# Patient Record
Sex: Male | Born: 1990 | Race: Black or African American | Hispanic: No | Marital: Married | Smoking: Current every day smoker
Health system: Southern US, Community
[De-identification: ages and names within clinical notes are randomized; demographics above are authoritative.]

## PROBLEM LIST (undated history)

## (undated) DIAGNOSIS — I509 Heart failure, unspecified: Secondary | ICD-10-CM

## (undated) DIAGNOSIS — E119 Type 2 diabetes mellitus without complications: Secondary | ICD-10-CM

## (undated) DIAGNOSIS — M419 Scoliosis, unspecified: Secondary | ICD-10-CM

## (undated) HISTORY — PX: WISDOM TOOTH EXTRACTION: SHX21

## (undated) HISTORY — PX: TONSILLECTOMY: SUR1361

---

## 2020-12-28 ENCOUNTER — Encounter (HOSPITAL_BASED_OUTPATIENT_CLINIC_OR_DEPARTMENT_OTHER): Payer: Self-pay | Admitting: Emergency Medicine

## 2020-12-28 ENCOUNTER — Emergency Department (HOSPITAL_BASED_OUTPATIENT_CLINIC_OR_DEPARTMENT_OTHER)
Admission: EM | Admit: 2020-12-28 | Discharge: 2020-12-28 | Disposition: A | Discharge status: Home / Self Care | Payer: 59 | Attending: Emergency Medicine | Admitting: Emergency Medicine

## 2020-12-28 ENCOUNTER — Other Ambulatory Visit: Payer: Self-pay

## 2020-12-28 DIAGNOSIS — M546 Pain in thoracic spine: Secondary | ICD-10-CM | POA: Insufficient documentation

## 2020-12-28 DIAGNOSIS — F17290 Nicotine dependence, other tobacco product, uncomplicated: Secondary | ICD-10-CM | POA: Insufficient documentation

## 2020-12-28 DIAGNOSIS — G8929 Other chronic pain: Secondary | ICD-10-CM | POA: Diagnosis not present

## 2020-12-28 DIAGNOSIS — I10 Essential (primary) hypertension: Secondary | ICD-10-CM | POA: Diagnosis not present

## 2020-12-28 DIAGNOSIS — E119 Type 2 diabetes mellitus without complications: Secondary | ICD-10-CM | POA: Diagnosis not present

## 2020-12-28 DIAGNOSIS — M5441 Lumbago with sciatica, right side: Secondary | ICD-10-CM | POA: Insufficient documentation

## 2020-12-28 HISTORY — DX: Scoliosis, unspecified: M41.9

## 2020-12-28 HISTORY — DX: Type 2 diabetes mellitus without complications: E11.9

## 2020-12-28 HISTORY — DX: Heart failure, unspecified: I50.9

## 2020-12-28 MED ORDER — TIZANIDINE HCL 4 MG PO CAPS: 4.0000 mg | ORAL_CAPSULE | Freq: Two times a day (BID) | ORAL | 0 refills | Status: AC | PRN

## 2020-12-28 MED ORDER — KETOROLAC TROMETHAMINE 60 MG/2ML IM SOLN
30.0000 mg | Freq: Once | INTRAMUSCULAR | Status: AC
  Administered 2020-12-28: 30 mg via INTRAMUSCULAR
  Filled 2020-12-28: qty 2

## 2020-12-28 MED ORDER — MORPHINE SULFATE (PF) 4 MG/ML IV SOLN
4.0000 mg | Freq: Once | INTRAVENOUS | Status: AC
Start: 2020-12-28 — End: 2020-12-28
  Administered 2020-12-28: 4 mg via INTRAMUSCULAR
  Filled 2020-12-28: qty 1

## 2020-12-28 NOTE — ED Provider Notes (Signed)
[facility] EMERGENCY DEPARTMENT Provider Note  CSN: [id number] Arrival date & time: 12/28/20 0008  Chief Complaint(s) Back Pain  HPI Gilbert Graham is a 30 y.o. male with a past medical history listed below including scoliosis with chronic back pain here for exacerbation of his back pain with associated radiculopathy down the right leg.  Patient denies any trauma.  No fevers.  No bladder/bowel incontinence.  Pain worse with movement and palpation.  Alleviated mildly with certain positions.   Back Pain  Past Medical History Past Medical History:  Diagnosis Date   CHF (congestive heart failure) (HCC)    Diabetes mellitus without complication (HCC)    Scoliosis    There are no problems to display for this patient.  Home Medication(s) Prior to Admission medications   Medication Sig Start Date End Date Taking? Authorizing Provider  tiZANidine (ZANAFLEX) 4 MG capsule Take 1 capsule (4 mg total) by mouth 2 (two) times daily as needed for muscle spasms. 12/28/20 01/27/21 Yes Bow Buntyn, Amadeo Garnet, MD                                                                                                                                    Past Surgical History Past Surgical History:  Procedure Laterality Date   TONSILLECTOMY     WISDOM TOOTH EXTRACTION     Family History Family History  Problem Relation Age of Onset   Asthma Mother    Heart failure Mother    Fibromyalgia Mother    Diabetes Father    Coronary artery disease Father     Social History Social History   Tobacco Use   Smoking status: Every Day    Types: Cigars   Smokeless tobacco: Never  Vaping Use   Vaping Use: Never used  Substance Use Topics   Alcohol use: Never   Drug use: Never   Allergies Amoxicillin and Imitrex [sumatriptan]  Review of Systems Review of Systems  Musculoskeletal:  Positive for back pain.  All other systems are reviewed and are negative for acute change except as noted in the  HPI  Physical Exam Vital Signs  I have reviewed the triage vital signs BP (!) 144/102 (BP Location: Right Arm)   Pulse (!) 103   Temp 98.9 F (37.2 C) (Oral)   Resp 20   Ht 5\' 6"  (1.676 m)   Wt 126.1 kg   SpO2 100%   BMI 44.87 kg/m   Physical Exam Vitals reviewed.  Constitutional:      General: He is not in acute distress.    Appearance: He is well-developed. He is obese. He is not diaphoretic.  HENT:     Head: Normocephalic and atraumatic.     Right Ear: External ear normal.     Left Ear: External ear normal.     Nose: Nose normal.     Mouth/Throat:     Mouth: Mucous membranes are moist.  Eyes:     General: No scleral icterus.    Conjunctiva/sclera: Conjunctivae normal.  Neck:     Trachea: Phonation normal.  Cardiovascular:     Rate and Rhythm: Normal rate and regular rhythm.  Pulmonary:     Effort: Pulmonary effort is normal. No respiratory distress.     Breath sounds: No stridor.  Abdominal:     General: There is no distension.  Musculoskeletal:        General: Normal range of motion.     Cervical back: Normal range of motion.     Thoracic back: Spasms and tenderness present.     Lumbar back: Spasms present.       Back:  Neurological:     Mental Status: He is alert and oriented to person, place, and time.     Comments: Spine Exam: Strength: 5/5 throughout LE bilaterally  Sensation: Intact to light touch in proximal and distal LE bilaterally    Psychiatric:        Behavior: Behavior normal.    ED Results and Treatments Labs (all labs ordered are listed, but only abnormal results are displayed) Labs Reviewed - No data to display                                                                                                                       EKG  EKG Interpretation  Date/Time:    Ventricular Rate:    PR Interval:    QRS Duration:   QT Interval:    QTC Calculation:   R Axis:     Text Interpretation:         Radiology No results  found.  Pertinent labs & imaging results that were available during my care of the patient were reviewed by me and considered in my medical decision making (see chart for details).  Medications Ordered in ED Medications  ketorolac (TORADOL) injection 30 mg (30 mg Intramuscular Given 12/28/20 0211)  morphine 4 MG/ML injection 4 mg (4 mg Intramuscular Given 12/28/20 0212)                                                                                                                                    Procedures Procedures  (including critical care time)  Medical Decision Making / ED Course I have reviewed the nursing notes for this encounter and the patient's prior records (if available in EHR or on provided  paperwork).   Radin Raptis was evaluated in Emergency Department on 12/28/2020 for the symptoms described in the history of present illness. He was evaluated in the context of the global COVID-19 pandemic, which necessitated consideration that the patient might be at risk for infection with the SARS-CoV-2 virus that causes COVID-19. Institutional protocols and algorithms that pertain to the evaluation of patients at risk for COVID-19 are in a state of rapid change based on information released by regulatory bodies including the CDC and federal and state organizations. These policies and algorithms were followed during the patient's care in the ED.  30 y.o. male presents with back pain in lumbar area for several weeks with signs of  right radicular pain. No acute traumatic onset. No red flag symptoms of fever, weight loss, saddle anesthesia, weakness, fecal/urinary incontinence or urinary retention.   Suspect MSK vs herniated disc. No indication for imaging emergently. Patient was recommended to take short course of scheduled NSAIDs and engage in early mobility as definitive treatment. Return precautions discussed for worsening or new concerning symptoms.        Final Clinical  Impression(s) / ED Diagnoses Final diagnoses:  Chronic bilateral low back pain with right-sided sciatica    The patient appears reasonably screened and/or stabilized for discharge and I doubt any other medical condition or other [facility] requiring further screening, evaluation, or treatment in the ED at this time prior to discharge. Safe for discharge with strict return precautions.  Disposition: Discharge  Condition: Good  I have discussed the results, Dx and Tx plan with the patient/family who expressed understanding and agree(s) with the plan. Discharge instructions discussed at length. The patient/family was given strict return precautions who verbalized understanding of the instructions. No further questions at time of discharge.    ED Discharge Orders          Ordered    tiZANidine (ZANAFLEX) 4 MG capsule  2 times daily PRN        12/28/20 0305             Follow Up: Primary care provider  Schedule an appointment as soon as possible for a visit  if you do not have a primary care physician, contact [facility] at [phone] for referral  Bedelia Person, MD [address] [place] [address] [phone]  Call  as needed     This chart was dictated using voice recognition software.  Despite best efforts to proofread,  errors can occur which can change the documentation meaning.    Nira Conn, MD 12/28/20 [phone]

## 2020-12-28 NOTE — ED Notes (Signed)
ED Provider at bedside. 

## 2020-12-28 NOTE — ED Triage Notes (Signed)
Pt is c/o lower back pain that started 2 weeks ago and has progressively gotten worse  Pt states the pain goes all the way across his lower back and pain is now radiating down his right leg  Pt states every now and then it pops and then burns  Pt has been using heat, ice, ibuprofen, and tylenol at home without relief

## 2021-02-06 DIAGNOSIS — M5416 Radiculopathy, lumbar region: Secondary | ICD-10-CM | POA: Diagnosis not present

## 2021-02-06 DIAGNOSIS — M545 Low back pain, unspecified: Secondary | ICD-10-CM | POA: Diagnosis not present

## 2021-02-11 DIAGNOSIS — I517 Cardiomegaly: Secondary | ICD-10-CM | POA: Diagnosis not present

## 2021-02-14 DIAGNOSIS — R69 Illness, unspecified: Secondary | ICD-10-CM | POA: Diagnosis not present

## 2021-02-14 DIAGNOSIS — M47816 Spondylosis without myelopathy or radiculopathy, lumbar region: Secondary | ICD-10-CM | POA: Diagnosis not present

## 2021-02-14 DIAGNOSIS — M5136 Other intervertebral disc degeneration, lumbar region: Secondary | ICD-10-CM | POA: Diagnosis not present

## 2021-02-14 DIAGNOSIS — M4306 Spondylolysis, lumbar region: Secondary | ICD-10-CM | POA: Diagnosis not present

## 2021-02-19 DIAGNOSIS — M4306 Spondylolysis, lumbar region: Secondary | ICD-10-CM | POA: Diagnosis not present

## 2021-02-19 DIAGNOSIS — M47816 Spondylosis without myelopathy or radiculopathy, lumbar region: Secondary | ICD-10-CM | POA: Diagnosis not present

## 2021-03-03 DIAGNOSIS — M4306 Spondylolysis, lumbar region: Secondary | ICD-10-CM | POA: Diagnosis not present

## 2021-03-03 DIAGNOSIS — M47816 Spondylosis without myelopathy or radiculopathy, lumbar region: Secondary | ICD-10-CM | POA: Diagnosis not present

## 2021-03-11 DIAGNOSIS — M792 Neuralgia and neuritis, unspecified: Secondary | ICD-10-CM | POA: Diagnosis not present

## 2021-03-11 DIAGNOSIS — M47816 Spondylosis without myelopathy or radiculopathy, lumbar region: Secondary | ICD-10-CM | POA: Diagnosis not present

## 2021-03-11 DIAGNOSIS — Z79891 Long term (current) use of opiate analgesic: Secondary | ICD-10-CM | POA: Diagnosis not present

## 2021-03-11 DIAGNOSIS — Z79899 Other long term (current) drug therapy: Secondary | ICD-10-CM | POA: Diagnosis not present

## 2021-03-11 DIAGNOSIS — G8929 Other chronic pain: Secondary | ICD-10-CM | POA: Diagnosis not present

## 2021-03-17 DIAGNOSIS — M47816 Spondylosis without myelopathy or radiculopathy, lumbar region: Secondary | ICD-10-CM | POA: Diagnosis not present

## 2021-03-31 DIAGNOSIS — M47816 Spondylosis without myelopathy or radiculopathy, lumbar region: Secondary | ICD-10-CM | POA: Diagnosis not present

## 2021-04-01 DIAGNOSIS — M47816 Spondylosis without myelopathy or radiculopathy, lumbar region: Secondary | ICD-10-CM | POA: Diagnosis not present

## 2021-04-03 DIAGNOSIS — E1143 Type 2 diabetes mellitus with diabetic autonomic (poly)neuropathy: Secondary | ICD-10-CM | POA: Diagnosis not present

## 2021-04-03 DIAGNOSIS — Z794 Long term (current) use of insulin: Secondary | ICD-10-CM | POA: Diagnosis not present

## 2021-04-03 DIAGNOSIS — I10 Essential (primary) hypertension: Secondary | ICD-10-CM | POA: Diagnosis not present

## 2021-04-03 DIAGNOSIS — M5441 Lumbago with sciatica, right side: Secondary | ICD-10-CM | POA: Diagnosis not present

## 2021-04-03 DIAGNOSIS — Z23 Encounter for immunization: Secondary | ICD-10-CM | POA: Diagnosis not present

## 2021-04-03 DIAGNOSIS — R1012 Left upper quadrant pain: Secondary | ICD-10-CM | POA: Diagnosis not present

## 2021-04-03 DIAGNOSIS — G8929 Other chronic pain: Secondary | ICD-10-CM | POA: Diagnosis not present

## 2021-04-03 DIAGNOSIS — E782 Mixed hyperlipidemia: Secondary | ICD-10-CM | POA: Diagnosis not present

## 2021-04-03 DIAGNOSIS — I509 Heart failure, unspecified: Secondary | ICD-10-CM | POA: Diagnosis not present

## 2021-04-30 DIAGNOSIS — M546 Pain in thoracic spine: Secondary | ICD-10-CM | POA: Diagnosis not present

## 2021-04-30 DIAGNOSIS — M47816 Spondylosis without myelopathy or radiculopathy, lumbar region: Secondary | ICD-10-CM | POA: Diagnosis not present

## 2021-04-30 DIAGNOSIS — G8929 Other chronic pain: Secondary | ICD-10-CM | POA: Diagnosis not present

## 2021-04-30 DIAGNOSIS — M792 Neuralgia and neuritis, unspecified: Secondary | ICD-10-CM | POA: Diagnosis not present

## 2021-06-19 DIAGNOSIS — I517 Cardiomegaly: Secondary | ICD-10-CM | POA: Diagnosis not present

## 2021-06-19 DIAGNOSIS — M546 Pain in thoracic spine: Secondary | ICD-10-CM | POA: Diagnosis not present

## 2021-06-23 DIAGNOSIS — M546 Pain in thoracic spine: Secondary | ICD-10-CM | POA: Diagnosis not present

## 2021-06-23 DIAGNOSIS — G8929 Other chronic pain: Secondary | ICD-10-CM | POA: Diagnosis not present

## 2021-06-23 DIAGNOSIS — M47816 Spondylosis without myelopathy or radiculopathy, lumbar region: Secondary | ICD-10-CM | POA: Diagnosis not present

## 2021-06-23 DIAGNOSIS — M792 Neuralgia and neuritis, unspecified: Secondary | ICD-10-CM | POA: Diagnosis not present

## 2021-10-22 DIAGNOSIS — G894 Chronic pain syndrome: Secondary | ICD-10-CM | POA: Diagnosis not present

## 2021-10-22 DIAGNOSIS — M791 Myalgia, unspecified site: Secondary | ICD-10-CM | POA: Diagnosis not present

## 2021-10-22 DIAGNOSIS — M5416 Radiculopathy, lumbar region: Secondary | ICD-10-CM | POA: Diagnosis not present

## 2021-10-29 ENCOUNTER — Emergency Department (HOSPITAL_BASED_OUTPATIENT_CLINIC_OR_DEPARTMENT_OTHER): Payer: 59

## 2021-10-29 ENCOUNTER — Encounter (HOSPITAL_BASED_OUTPATIENT_CLINIC_OR_DEPARTMENT_OTHER): Payer: Self-pay

## 2021-10-29 ENCOUNTER — Inpatient Hospital Stay (HOSPITAL_BASED_OUTPATIENT_CLINIC_OR_DEPARTMENT_OTHER)
Admission: EM | Admit: 2021-10-29 | Discharge: 2021-11-01 | DRG: 948 | Disposition: A | Discharge status: Home / Self Care | Payer: 59 | Attending: Internal Medicine | Admitting: Internal Medicine

## 2021-10-29 DIAGNOSIS — A419 Sepsis, unspecified organism: Secondary | ICD-10-CM

## 2021-10-29 DIAGNOSIS — R9431 Abnormal electrocardiogram [ECG] [EKG]: Secondary | ICD-10-CM | POA: Diagnosis present

## 2021-10-29 DIAGNOSIS — Z881 Allergy status to other antibiotic agents status: Secondary | ICD-10-CM

## 2021-10-29 DIAGNOSIS — R000 Tachycardia, unspecified: Secondary | ICD-10-CM | POA: Diagnosis not present

## 2021-10-29 DIAGNOSIS — R109 Unspecified abdominal pain: Secondary | ICD-10-CM | POA: Diagnosis not present

## 2021-10-29 DIAGNOSIS — Z20822 Contact with and (suspected) exposure to covid-19: Secondary | ICD-10-CM | POA: Diagnosis present

## 2021-10-29 DIAGNOSIS — R7401 Elevation of levels of liver transaminase levels: Principal | ICD-10-CM | POA: Diagnosis present

## 2021-10-29 DIAGNOSIS — R079 Chest pain, unspecified: Secondary | ICD-10-CM | POA: Diagnosis not present

## 2021-10-29 DIAGNOSIS — F4310 Post-traumatic stress disorder, unspecified: Secondary | ICD-10-CM | POA: Diagnosis present

## 2021-10-29 DIAGNOSIS — E876 Hypokalemia: Secondary | ICD-10-CM | POA: Diagnosis present

## 2021-10-29 DIAGNOSIS — R112 Nausea with vomiting, unspecified: Secondary | ICD-10-CM | POA: Diagnosis not present

## 2021-10-29 DIAGNOSIS — I5022 Chronic systolic (congestive) heart failure: Secondary | ICD-10-CM | POA: Diagnosis present

## 2021-10-29 DIAGNOSIS — I272 Pulmonary hypertension, unspecified: Secondary | ICD-10-CM | POA: Diagnosis present

## 2021-10-29 DIAGNOSIS — R7989 Other specified abnormal findings of blood chemistry: Secondary | ICD-10-CM | POA: Diagnosis present

## 2021-10-29 DIAGNOSIS — Z886 Allergy status to analgesic agent status: Secondary | ICD-10-CM

## 2021-10-29 DIAGNOSIS — F3181 Bipolar II disorder: Secondary | ICD-10-CM | POA: Diagnosis present

## 2021-10-29 DIAGNOSIS — Z8249 Family history of ischemic heart disease and other diseases of the circulatory system: Secondary | ICD-10-CM

## 2021-10-29 DIAGNOSIS — R748 Abnormal levels of other serum enzymes: Secondary | ICD-10-CM | POA: Diagnosis not present

## 2021-10-29 DIAGNOSIS — R1011 Right upper quadrant pain: Secondary | ICD-10-CM

## 2021-10-29 DIAGNOSIS — Z833 Family history of diabetes mellitus: Secondary | ICD-10-CM

## 2021-10-29 DIAGNOSIS — E119 Type 2 diabetes mellitus without complications: Secondary | ICD-10-CM | POA: Diagnosis present

## 2021-10-29 DIAGNOSIS — Z91148 Patient's other noncompliance with medication regimen for other reason: Secondary | ICD-10-CM

## 2021-10-29 DIAGNOSIS — F17290 Nicotine dependence, other tobacco product, uncomplicated: Secondary | ICD-10-CM | POA: Diagnosis present

## 2021-10-29 DIAGNOSIS — I509 Heart failure, unspecified: Secondary | ICD-10-CM

## 2021-10-29 DIAGNOSIS — Z6841 Body Mass Index (BMI) 40.0 and over, adult: Secondary | ICD-10-CM

## 2021-10-29 DIAGNOSIS — R1031 Right lower quadrant pain: Secondary | ICD-10-CM | POA: Diagnosis not present

## 2021-10-29 DIAGNOSIS — K5900 Constipation, unspecified: Secondary | ICD-10-CM | POA: Diagnosis present

## 2021-10-29 DIAGNOSIS — I429 Cardiomyopathy, unspecified: Secondary | ICD-10-CM | POA: Diagnosis present

## 2021-10-29 DIAGNOSIS — Z716 Tobacco abuse counseling: Secondary | ICD-10-CM

## 2021-10-29 DIAGNOSIS — F603 Borderline personality disorder: Secondary | ICD-10-CM | POA: Diagnosis present

## 2021-10-29 LAB — CBC
HCT: 44.4 % (ref 39.0–52.0)
Hemoglobin: 13.7 g/dL (ref 13.0–17.0)
MCH: 21.5 pg — ABNORMAL LOW (ref 26.0–34.0)
MCHC: 30.9 g/dL (ref 30.0–36.0)
MCV: 69.6 fL — ABNORMAL LOW (ref 80.0–100.0)
Platelets: 301 10*3/uL (ref 150–400)
RBC: 6.38 MIL/uL — ABNORMAL HIGH (ref 4.22–5.81)
RDW: 18.4 % — ABNORMAL HIGH (ref 11.5–15.5)
WBC: 13.2 10*3/uL — ABNORMAL HIGH (ref 4.0–10.5)
nRBC: 1.1 % — ABNORMAL HIGH (ref 0.0–0.2)

## 2021-10-29 LAB — COMPREHENSIVE METABOLIC PANEL
ALT: 1682 U/L — ABNORMAL HIGH (ref 0–44)
AST: 1744 U/L — ABNORMAL HIGH (ref 15–41)
Albumin: 3.5 g/dL (ref 3.5–5.0)
Alkaline Phosphatase: 133 U/L — ABNORMAL HIGH (ref 38–126)
Anion gap: 9 (ref 5–15)
BUN: 18 mg/dL (ref 6–20)
CO2: 22 mmol/L (ref 22–32)
Calcium: 8.2 mg/dL — ABNORMAL LOW (ref 8.9–10.3)
Chloride: 104 mmol/L (ref 98–111)
Creatinine, Ser: 1.14 mg/dL (ref 0.61–1.24)
GFR, Estimated: >60 mL/min (ref >60)
Glucose, Bld: 214 mg/dL — ABNORMAL HIGH (ref 70–99)
Potassium: 3.4 mmol/L — ABNORMAL LOW (ref 3.5–5.1)
Sodium: 135 mmol/L (ref 135–145)
Total Bilirubin: 1.5 mg/dL — ABNORMAL HIGH (ref 0.3–1.2)
Total Protein: 7 g/dL (ref 6.5–8.1)

## 2021-10-29 LAB — URINALYSIS, ROUTINE W REFLEX MICROSCOPIC
Bilirubin Urine: NEGATIVE
Glucose, UA: >=500 mg/dL — AB
Hgb urine dipstick: NEGATIVE
Ketones, ur: NEGATIVE mg/dL
Leukocytes,Ua: NEGATIVE
Nitrite: NEGATIVE
Protein, ur: 100 mg/dL — AB
Specific Gravity, Urine: 1.02 (ref 1.005–1.030)
pH: 5.5 (ref 5.0–8.0)

## 2021-10-29 LAB — URINALYSIS, MICROSCOPIC (REFLEX): WBC, UA: NONE SEEN WBC/hpf (ref 0–5)

## 2021-10-29 LAB — LACTIC ACID, PLASMA: Lactic Acid, Venous: 1.5 mmol/L (ref 0.5–1.9)

## 2021-10-29 LAB — LIPASE, BLOOD: Lipase: 55 U/L — ABNORMAL HIGH (ref 11–51)

## 2021-10-29 LAB — PROTIME-INR
INR: 1.5 — ABNORMAL HIGH (ref 0.8–1.2)
Prothrombin Time: 18.4 seconds — ABNORMAL HIGH (ref 11.4–15.2)

## 2021-10-29 LAB — APTT: aPTT: 31 seconds (ref 24–36)

## 2021-10-29 LAB — CBG MONITORING, ED: Glucose-Capillary: 218 mg/dL — ABNORMAL HIGH (ref 70–99)

## 2021-10-29 MED ORDER — PIPERACILLIN-TAZOBACTAM 3.375 G IVPB 30 MIN
3.3750 g | Freq: Once | INTRAVENOUS | Status: AC
  Administered 2021-10-29: 3.375 g via INTRAVENOUS
  Filled 2021-10-29: qty 50

## 2021-10-29 MED ORDER — IOHEXOL 300 MG/ML  SOLN
125.0000 mL | Freq: Once | INTRAMUSCULAR | Status: AC | PRN
  Administered 2021-10-29: 125 mL via INTRAVENOUS

## 2021-10-29 MED ORDER — MORPHINE SULFATE (PF) 4 MG/ML IV SOLN
4.0000 mg | Freq: Once | INTRAVENOUS | Status: AC
  Administered 2021-10-29: 4 mg via INTRAVENOUS
  Filled 2021-10-29: qty 1

## 2021-10-29 MED ORDER — SODIUM CHLORIDE 0.9 % IV SOLN: INTRAVENOUS | Status: DC | PRN

## 2021-10-29 MED ORDER — SODIUM CHLORIDE 0.9 % IV BOLUS
1000.0000 mL | Freq: Once | INTRAVENOUS | Status: AC
Start: 2021-10-29 — End: 2021-10-29
  Administered 2021-10-29: 1000 mL via INTRAVENOUS

## 2021-10-29 MED ORDER — ONDANSETRON HCL 4 MG/2ML IJ SOLN
4.0000 mg | Freq: Once | INTRAMUSCULAR | Status: AC
Start: 2021-10-29 — End: 2021-10-29
  Administered 2021-10-29: 4 mg via INTRAVENOUS
  Filled 2021-10-29: qty 2

## 2021-10-29 NOTE — ED Provider Notes (Incomplete)
[facility] EMERGENCY DEPARTMENT Provider Note   CSN: [id number] Arrival date & time: 10/29/21  1715     History {Add pertinent medical, surgical, social history, OB history to HPI:1} Chief Complaint  Patient presents with   Abdominal Pain    Gilbert Graham is a 31 y.o. male.   Abdominal Pain     Home Medications Prior to Admission medications   Not on File      Allergies    Amoxicillin and Imitrex [sumatriptan]    Review of Systems   Review of Systems  Gastrointestinal:  Positive for abdominal pain.   Physical Exam Updated Vital Signs BP 116/87 (BP Location: Left Arm)   Pulse 94   Temp 98.5 F (36.9 C) (Oral)   Resp (!) 24   Ht 5\' 6"  (1.676 m)   Wt (!) 142.6 kg   SpO2 99%   BMI 50.75 kg/m  Physical Exam  ED Results / Procedures / Treatments   Labs (all labs ordered are listed, but only abnormal results are displayed) Labs Reviewed  LIPASE, BLOOD - Abnormal; Notable for the following components:      Result Value   Lipase 55 (*)    All other components within normal limits  COMPREHENSIVE METABOLIC PANEL - Abnormal; Notable for the following components:   Potassium 3.4 (*)    Glucose, Bld 214 (*)    Calcium 8.2 (*)    AST 1,744 (*)    ALT 1,682 (*)    Alkaline Phosphatase 133 (*)    Total Bilirubin 1.5 (*)    All other components within normal limits  CBC - Abnormal; Notable for the following components:   WBC 13.2 (*)    RBC 6.38 (*)    MCV 69.6 (*)    MCH 21.5 (*)    RDW 18.4 (*)    nRBC 1.1 (*)    All other components within normal limits  URINALYSIS, ROUTINE W REFLEX MICROSCOPIC - Abnormal; Notable for the following components:   Glucose, UA >=500 (*)    Protein, ur 100 (*)    All other components within normal limits  URINALYSIS, MICROSCOPIC (REFLEX) - Abnormal; Notable for the following components:   Bacteria, UA RARE (*)    All other components within normal limits  PROTIME-INR - Abnormal; Notable for the following  components:   Prothrombin Time 18.4 (*)    INR 1.5 (*)    All other components within normal limits  CBG MONITORING, ED - Abnormal; Notable for the following components:   Glucose-Capillary 218 (*)    All other components within normal limits  CULTURE, BLOOD (ROUTINE X 2)  CULTURE, BLOOD (ROUTINE X 2)  LACTIC ACID, PLASMA  APTT  LACTIC ACID, PLASMA    EKG None  Radiology CT Abdomen Pelvis W Contrast  Result Date: 10/29/2021 CLINICAL DATA:  Nausea/vomiting RUQ pain. 31 y/o male. Pt reports right upper abdominal pain radiating to right back since Monday. Pain increasing and has became constant. Reports projectile vomiting today x2 episode. EXAM: CT ABDOMEN AND PELVIS WITH CONTRAST TECHNIQUE: Multidetector CT imaging of the abdomen and pelvis was performed using the standard protocol following bolus administration of intravenous contrast. RADIATION DOSE REDUCTION: This exam was performed according to the departmental dose-optimization program which includes automated exposure control, adjustment of the mA and/or kV according to patient size and/or use of iterative reconstruction technique. CONTRAST:  OMNIPAQUE IOHEXOL 300 MG/ML  SOLN COMPARISON:  None Available. FINDINGS: Lower chest: Prominent cardiac silhouette. Mild peribronchovascular  ground-glass airspace opacity. No acute abnormality. Hepatobiliary: No focal liver abnormality. No gallstones, gallbladder wall thickening, or pericholecystic fluid. No biliary dilatation. Pancreas: No focal lesion. Normal pancreatic contour. No surrounding inflammatory changes. No main pancreatic ductal dilatation. Spleen: Normal in size without focal abnormality. Adrenals/Urinary Tract: No adrenal nodule bilaterally. Bilateral kidneys enhance symmetrically. No hydronephrosis. No hydroureter. The urinary bladder is unremarkable. Stomach/Bowel: Stomach is within normal limits. No evidence of bowel wall thickening or dilatation. Appendix appears normal.  Vascular/Lymphatic: No abdominal aorta or iliac aneurysm. No abdominal, pelvic, or inguinal lymphadenopathy. Reproductive: Prostate is unremarkable. Other: No intraperitoneal free fluid. No intraperitoneal free gas. No organized fluid collection. Musculoskeletal: Subcutaneus soft tissue edema.  No hernia. No suspicious lytic or blastic osseous lesions. No acute displaced fracture. Multilevel degenerative changes of the spine. IMPRESSION: 1. No acute intra-abdominal or intrapelvic abnormality. 2. Mild pulmonary edema in the setting of cardiomegaly. Electronically Signed   By: Iven Finn M.D.   On: 10/29/2021 22:27   DG Chest Port 1 View  Result Date: 10/29/2021 CLINICAL DATA:  Upper abdominal pain EXAM: PORTABLE CHEST 1 VIEW COMPARISON:  None Available. FINDINGS: Lungs are clear.  No pleural effusion or pneumothorax. Cardiomegaly. IMPRESSION: Cardiomegaly.  No evidence of acute cardiopulmonary disease. Electronically Signed   By: Julian Hy M.D.   On: 10/29/2021 21:17   US Abdomen Limited RUQ (LIVER/GB)  Result Date: 10/29/2021 CLINICAL DATA:  Pain EXAM: ULTRASOUND ABDOMEN LIMITED RIGHT UPPER QUADRANT COMPARISON:  None Available. FINDINGS: Gallbladder: No gallstones or wall thickening visualized. No sonographic Murphy sign noted by sonographer. Common bile duct: Diameter: 4 mm Liver: Within the upper limits of normal for parenchymal echotexture. No focal hepatic lesion is seen. Portal vein is patent on color Doppler imaging with normal direction of blood flow towards the liver. Other: None. IMPRESSION: Negative right upper quadrant ultrasound. Electronically Signed   By: Julian Hy M.D.   On: 10/29/2021 21:59    Procedures Procedures  {Document cardiac monitor, telemetry assessment procedure when appropriate:1}  Medications Ordered in ED Medications  0.9 %  sodium chloride infusion ( Intravenous New Bag/Given 10/29/21 2057)  sodium chloride 0.9 % bolus 1,000 mL (1,000 mLs  Intravenous New Bag/Given 10/29/21 2056)  morphine (PF) 4 MG/ML injection 4 mg (4 mg Intravenous Given 10/29/21 2059)  ondansetron (ZOFRAN) injection 4 mg (4 mg Intravenous Given 10/29/21 2059)  piperacillin-tazobactam (ZOSYN) IVPB 3.375 g (0 g Intravenous Stopped 10/29/21 2128)  iohexol (OMNIPAQUE) 300 MG/ML solution 125 mL (125 mLs Intravenous Contrast Given 10/29/21 2159)    ED Course/ Medical Decision Making/ A&P                           Medical Decision Making Amount and/or Complexity of Data Reviewed Labs: ordered. Radiology: ordered.  Risk Prescription drug management.   ***  {Document critical care time when appropriate:1} {Document review of labs and clinical decision tools ie heart score, Chads2Vasc2 etc:1}  {Document your independent review of radiology images, and any outside records:1} {Document your discussion with family members, caretakers, and with consultants:1} {Document social determinants of health affecting pt's care:1} {Document your decision making why or why not admission, treatments were needed:1} Final Clinical Impression(s) / ED Diagnoses Final diagnoses:  None    Rx / DC Orders ED Discharge Orders     None

## 2021-10-29 NOTE — ED Triage Notes (Addendum)
Pt reports right upper abdominal pain radiating to right back since Monday. Pain increasing  and has became constant. Reports projectile vomiting today x2 episode. Last BM 4 days ago  Given phenergan supp PTA

## 2021-10-29 NOTE — ED Notes (Signed)
Informed Dr. Pearline Cables that pt's requesting pain and nausea meds. MD acknowledged.

## 2021-10-29 NOTE — ED Provider Notes (Incomplete)
[facility] Provider Note   CSN: [id number] Arrival date & time: 10/29/21  1715     History {Add pertinent medical, surgical, social history, OB history to HPI:1} Chief Complaint  Patient presents with   Abdominal Pain    [facility] is a 31 y.o. male.  Patient is a 31 year old male with no significant past medical history presenting for complaints abdominal pain.  Patient mitts to epigastric and right upper quadrant abdominal pain that began on Monday.  Patient is now in severe pain, nausea, vomiting, decreased appetite.  Denies any fevers, chills, diarrhea.  No sick contacts.  No prior abdominal surgeries.  Denies hx of hepatitis  No hx of liver problems No tylenol use  The history is provided by the patient. No language interpreter was used.  Abdominal Pain Associated symptoms: nausea and vomiting   Associated symptoms: no chest pain, no chills, no cough, no dysuria, no fever, no hematuria, no shortness of breath and no sore throat       Home Medications Prior to Admission medications   Not on File      Allergies    Amoxicillin and Imitrex [sumatriptan]    Review of Systems   Review of Systems  Constitutional:  Negative for chills and fever.  HENT:  Negative for ear pain and sore throat.   Eyes:  Negative for pain and visual disturbance.  Respiratory:  Negative for cough and shortness of breath.   Cardiovascular:  Negative for chest pain and palpitations.  Gastrointestinal:  Positive for abdominal pain, nausea and vomiting.  Genitourinary:  Negative for dysuria and hematuria.  Musculoskeletal:  Negative for arthralgias and back pain.  Skin:  Negative for color change and rash.  Neurological:  Negative for seizures and syncope.  All other systems reviewed and are negative.  Physical Exam Updated Vital Signs BP 116/87 (BP Location: Left Arm)   Pulse 94   Temp 98.5 F (36.9 C) (Oral)   Resp (!) 24   Ht 5\' 6"  (1.676 m)   Wt  (!) 142.6 kg   SpO2 99%   BMI 50.75 kg/m  Physical Exam Vitals and nursing note reviewed.  Constitutional:      General: He is in acute distress.     Appearance: He is well-developed. He is ill-appearing.  HENT:     Head: Normocephalic and atraumatic.  Eyes:     Conjunctiva/sclera: Conjunctivae normal.  Cardiovascular:     Rate and Rhythm: Normal rate and regular rhythm.     Heart sounds: No murmur heard. Pulmonary:     Effort: Pulmonary effort is normal. No respiratory distress.     Breath sounds: Normal breath sounds.  Abdominal:     Palpations: Abdomen is soft.     Tenderness: There is abdominal tenderness in the right upper quadrant and epigastric area. There is guarding.  Musculoskeletal:        General: No swelling.     Cervical back: Neck supple.  Skin:    General: Skin is warm and dry.     Capillary Refill: Capillary refill takes less than 2 seconds.  Neurological:     Mental Status: He is alert.  Psychiatric:        Mood and Affect: Mood normal.    ED Results / Procedures / Treatments   Labs (all labs ordered are listed, but only abnormal results are displayed) Labs Reviewed  LIPASE, BLOOD - Abnormal; Notable for the following components:      Result  Value   Lipase 55 (*)    All other components within normal limits  COMPREHENSIVE METABOLIC PANEL - Abnormal; Notable for the following components:   Potassium 3.4 (*)    Glucose, Bld 214 (*)    Calcium 8.2 (*)    AST 1,744 (*)    ALT 1,682 (*)    Alkaline Phosphatase 133 (*)    Total Bilirubin 1.5 (*)    All other components within normal limits  CBC - Abnormal; Notable for the following components:   WBC 13.2 (*)    RBC 6.38 (*)    MCV 69.6 (*)    MCH 21.5 (*)    RDW 18.4 (*)    nRBC 1.1 (*)    All other components within normal limits  URINALYSIS, ROUTINE W REFLEX MICROSCOPIC - Abnormal; Notable for the following components:   Glucose, UA >=500 (*)    Protein, ur 100 (*)    All other components  within normal limits  URINALYSIS, MICROSCOPIC (REFLEX) - Abnormal; Notable for the following components:   Bacteria, UA RARE (*)    All other components within normal limits  PROTIME-INR - Abnormal; Notable for the following components:   Prothrombin Time 18.4 (*)    INR 1.5 (*)    All other components within normal limits  CBG MONITORING, ED - Abnormal; Notable for the following components:   Glucose-Capillary 218 (*)    All other components within normal limits  CULTURE, BLOOD (ROUTINE X 2)  CULTURE, BLOOD (ROUTINE X 2)  LACTIC ACID, PLASMA  APTT  LACTIC ACID, PLASMA    EKG None  Radiology CT Abdomen Pelvis W Contrast  Result Date: 10/29/2021 CLINICAL DATA:  Nausea/vomiting RUQ pain. 31 y/o male. Pt reports right upper abdominal pain radiating to right back since Monday. Pain increasing and has became constant. Reports projectile vomiting today x2 episode. EXAM: CT ABDOMEN AND PELVIS WITH CONTRAST TECHNIQUE: Multidetector CT imaging of the abdomen and pelvis was performed using the standard protocol following bolus administration of intravenous contrast. RADIATION DOSE REDUCTION: This exam was performed according to the departmental dose-optimization program which includes automated exposure control, adjustment of the mA and/or kV according to patient size and/or use of iterative reconstruction technique. CONTRAST:  127mL OMNIPAQUE IOHEXOL 300 MG/ML  SOLN COMPARISON:  None Available. FINDINGS: Lower chest: Prominent cardiac silhouette. Mild peribronchovascular ground-glass airspace opacity. No acute abnormality. Hepatobiliary: No focal liver abnormality. No gallstones, gallbladder wall thickening, or pericholecystic fluid. No biliary dilatation. Pancreas: No focal lesion. Normal pancreatic contour. No surrounding inflammatory changes. No main pancreatic ductal dilatation. Spleen: Normal in size without focal abnormality. Adrenals/Urinary Tract: No adrenal nodule bilaterally. Bilateral  kidneys enhance symmetrically. No hydronephrosis. No hydroureter. The urinary bladder is unremarkable. Stomach/Bowel: Stomach is within normal limits. No evidence of bowel wall thickening or dilatation. Appendix appears normal. Vascular/Lymphatic: No abdominal aorta or iliac aneurysm. No abdominal, pelvic, or inguinal lymphadenopathy. Reproductive: Prostate is unremarkable. Other: No intraperitoneal free fluid. No intraperitoneal free gas. No organized fluid collection. Musculoskeletal: Subcutaneus soft tissue edema.  No hernia. No suspicious lytic or blastic osseous lesions. No acute displaced fracture. Multilevel degenerative changes of the spine. IMPRESSION: 1. No acute intra-abdominal or intrapelvic abnormality. 2. Mild pulmonary edema in the setting of cardiomegaly. Electronically Signed   By: Iven Finn M.D.   On: 10/29/2021 22:27   DG Chest Port 1 View  Result Date: 10/29/2021 CLINICAL DATA:  Upper abdominal pain EXAM: PORTABLE CHEST 1 VIEW COMPARISON:  None Available. FINDINGS: Lungs are  clear.  No pleural effusion or pneumothorax. Cardiomegaly. IMPRESSION: Cardiomegaly.  No evidence of acute cardiopulmonary disease. Electronically Signed   By: Julian Hy M.D.   On: 10/29/2021 21:17   US Abdomen Limited RUQ (LIVER/GB)  Result Date: 10/29/2021 CLINICAL DATA:  Pain EXAM: ULTRASOUND ABDOMEN LIMITED RIGHT UPPER QUADRANT COMPARISON:  None Available. FINDINGS: Gallbladder: No gallstones or wall thickening visualized. No sonographic Murphy sign noted by sonographer. Common bile duct: Diameter: 4 mm Liver: Within the upper limits of normal for parenchymal echotexture. No focal hepatic lesion is seen. Portal vein is patent on color Doppler imaging with normal direction of blood flow towards the liver. Other: None. IMPRESSION: Negative right upper quadrant ultrasound. Electronically Signed   By: Julian Hy M.D.   On: 10/29/2021 21:59    Procedures Procedures  {Document cardiac monitor,  telemetry assessment procedure when appropriate:1}  Medications Ordered in ED Medications  0.9 %  sodium chloride infusion ( Intravenous New Bag/Given 10/29/21 2057)  sodium chloride 0.9 % bolus 1,000 mL (1,000 mLs Intravenous New Bag/Given 10/29/21 2056)  morphine (PF) 4 MG/ML injection 4 mg (4 mg Intravenous Given 10/29/21 2059)  ondansetron (ZOFRAN) injection 4 mg (4 mg Intravenous Given 10/29/21 2059)  piperacillin-tazobactam (ZOSYN) IVPB 3.375 g (0 g Intravenous Stopped 10/29/21 2128)  iohexol (OMNIPAQUE) 300 MG/ML solution 125 mL (125 mLs Intravenous Contrast Given 10/29/21 2159)    ED Course/ Medical Decision Making/ A&P                           Medical Decision Making Amount and/or Complexity of Data Reviewed Labs: ordered. Radiology: ordered.  Risk Prescription drug management. Decision regarding hospitalization.   46:110 PM 31 year old male with no significant past medical history presenting for complaints abdominal pain.  Patient is alert and oriented x3, no acute distress, afebrile, stable vital signs.  Physical exam demonstrates ill-appearing male, diaphoretic, afebrile, writhing around in pain.  Physical exam demonstrates soft abdomen with tenderness to palpation in the right upper quadrant epigastric regions.  Rating present.  No rigidity.  Concern for sepsis likely intra-abdominal in etiology.  Blood cultures and lactic acid ordered.  Broad-spectrum antibiotics ordered.  IV fluids, morphine, and Zofran given for symptomatic management.   CT abdomen demonstrates no acute process.  Laboratory studies concerning for pneumonitis with elevated bili total.  Right upper quadrant demonstrates no acute process. Common Bile duct 4 mm.    Denies hx of hepatitis  No hx of liver problems No tylenol use  Nonetheless, concerns for possible choledocholithiasis.  I spoke with GI consultant Dr.*Agrees to see patient.  I spoke with admitting physician Dr.Opyd who agrees accept  patient.  {Document critical care time when appropriate:1} {Document review of labs and clinical decision tools ie heart score, Chads2Vasc2 etc:1}  {Document your independent review of radiology images, and any outside records:1} {Document your discussion with family members, caretakers, and with consultants:1} {Document social determinants of health affecting pt's care:1} {Document your decision making why or why not admission, treatments were needed:1} Final Clinical Impression(s) / ED Diagnoses Final diagnoses:  Transaminitis  Hyperbilirubinemia  Elevated lipase  Right upper quadrant abdominal pain  Sepsis, due to unspecified organism, unspecified whether acute organ dysfunction present [facility])    Rx / DC Orders ED Discharge Orders     None

## 2021-10-30 ENCOUNTER — Other Ambulatory Visit: Payer: Self-pay

## 2021-10-30 ENCOUNTER — Encounter (HOSPITAL_COMMUNITY): Payer: Self-pay | Admitting: Family Medicine

## 2021-10-30 ENCOUNTER — Observation Stay (HOSPITAL_COMMUNITY): Payer: 59

## 2021-10-30 DIAGNOSIS — I5022 Chronic systolic (congestive) heart failure: Secondary | ICD-10-CM

## 2021-10-30 DIAGNOSIS — F3181 Bipolar II disorder: Secondary | ICD-10-CM | POA: Diagnosis not present

## 2021-10-30 DIAGNOSIS — I272 Pulmonary hypertension, unspecified: Secondary | ICD-10-CM | POA: Diagnosis not present

## 2021-10-30 DIAGNOSIS — I429 Cardiomyopathy, unspecified: Secondary | ICD-10-CM | POA: Diagnosis not present

## 2021-10-30 DIAGNOSIS — Z881 Allergy status to other antibiotic agents status: Secondary | ICD-10-CM | POA: Diagnosis not present

## 2021-10-30 DIAGNOSIS — E119 Type 2 diabetes mellitus without complications: Secondary | ICD-10-CM

## 2021-10-30 DIAGNOSIS — Z833 Family history of diabetes mellitus: Secondary | ICD-10-CM | POA: Diagnosis not present

## 2021-10-30 DIAGNOSIS — F4310 Post-traumatic stress disorder, unspecified: Secondary | ICD-10-CM | POA: Diagnosis not present

## 2021-10-30 DIAGNOSIS — Z8249 Family history of ischemic heart disease and other diseases of the circulatory system: Secondary | ICD-10-CM | POA: Diagnosis not present

## 2021-10-30 DIAGNOSIS — R945 Abnormal results of liver function studies: Secondary | ICD-10-CM | POA: Diagnosis not present

## 2021-10-30 DIAGNOSIS — E876 Hypokalemia: Secondary | ICD-10-CM | POA: Diagnosis present

## 2021-10-30 DIAGNOSIS — R7401 Elevation of levels of liver transaminase levels: Secondary | ICD-10-CM | POA: Diagnosis not present

## 2021-10-30 DIAGNOSIS — Z886 Allergy status to analgesic agent status: Secondary | ICD-10-CM | POA: Diagnosis not present

## 2021-10-30 DIAGNOSIS — Z91148 Patient's other noncompliance with medication regimen for other reason: Secondary | ICD-10-CM | POA: Diagnosis not present

## 2021-10-30 DIAGNOSIS — Z20822 Contact with and (suspected) exposure to covid-19: Secondary | ICD-10-CM | POA: Diagnosis not present

## 2021-10-30 DIAGNOSIS — Z716 Tobacco abuse counseling: Secondary | ICD-10-CM | POA: Diagnosis not present

## 2021-10-30 DIAGNOSIS — F603 Borderline personality disorder: Secondary | ICD-10-CM | POA: Diagnosis not present

## 2021-10-30 DIAGNOSIS — R7989 Other specified abnormal findings of blood chemistry: Secondary | ICD-10-CM

## 2021-10-30 DIAGNOSIS — R69 Illness, unspecified: Secondary | ICD-10-CM | POA: Diagnosis not present

## 2021-10-30 DIAGNOSIS — K5900 Constipation, unspecified: Secondary | ICD-10-CM | POA: Diagnosis not present

## 2021-10-30 DIAGNOSIS — F17290 Nicotine dependence, other tobacco product, uncomplicated: Secondary | ICD-10-CM | POA: Diagnosis not present

## 2021-10-30 DIAGNOSIS — R109 Unspecified abdominal pain: Secondary | ICD-10-CM | POA: Diagnosis not present

## 2021-10-30 DIAGNOSIS — Z6841 Body Mass Index (BMI) 40.0 and over, adult: Secondary | ICD-10-CM | POA: Diagnosis not present

## 2021-10-30 DIAGNOSIS — R9431 Abnormal electrocardiogram [ECG] [EKG]: Secondary | ICD-10-CM

## 2021-10-30 LAB — CBC WITH DIFFERENTIAL/PLATELET
Abs Immature Granulocytes: 0.05 10*3/uL (ref 0.00–0.07)
Basophils Absolute: 0.1 10*3/uL (ref 0.0–0.1)
Basophils Relative: 0 %
Eosinophils Absolute: 0.2 10*3/uL (ref 0.0–0.5)
Eosinophils Relative: 2 %
HCT: 42.9 % (ref 39.0–52.0)
Hemoglobin: 12.6 g/dL — ABNORMAL LOW (ref 13.0–17.0)
Immature Granulocytes: 0 %
Lymphocytes Relative: 20 %
Lymphs Abs: 2.3 10*3/uL (ref 0.7–4.0)
MCH: 21.3 pg — ABNORMAL LOW (ref 26.0–34.0)
MCHC: 29.4 g/dL — ABNORMAL LOW (ref 30.0–36.0)
MCV: 72.5 fL — ABNORMAL LOW (ref 80.0–100.0)
Monocytes Absolute: 1.4 10*3/uL — ABNORMAL HIGH (ref 0.1–1.0)
Monocytes Relative: 12 %
Neutro Abs: 7.3 10*3/uL (ref 1.7–7.7)
Neutrophils Relative %: 66 %
Platelets: 264 10*3/uL (ref 150–400)
RBC: 5.92 MIL/uL — ABNORMAL HIGH (ref 4.22–5.81)
RDW: 18.5 % — ABNORMAL HIGH (ref 11.5–15.5)
WBC: 11.3 10*3/uL — ABNORMAL HIGH (ref 4.0–10.5)
nRBC: 0.4 % — ABNORMAL HIGH (ref 0.0–0.2)

## 2021-10-30 LAB — RESPIRATORY PANEL BY PCR

## 2021-10-30 LAB — RAPID URINE DRUG SCREEN, HOSP PERFORMED
Amphetamines: POSITIVE — AB
Barbiturates: NOT DETECTED
Benzodiazepines: POSITIVE — AB
Cocaine: NOT DETECTED
Opiates: POSITIVE — AB
Tetrahydrocannabinol: POSITIVE — AB

## 2021-10-30 LAB — ECHOCARDIOGRAM COMPLETE
AR max vel: 3.04 cm2
AV Peak grad: 3.3 mmHg
Ao pk vel: 0.91 m/s
Area-P 1/2: 6.54 cm2
Height: 66 in
S' Lateral: 6.3 cm
Single Plane A4C EF: 22.2 %
Weight: 5068.82 oz

## 2021-10-30 LAB — COMPREHENSIVE METABOLIC PANEL
ALT: 1350 U/L — ABNORMAL HIGH (ref 0–44)
AST: 1037 U/L — ABNORMAL HIGH (ref 15–41)
Albumin: 3.4 g/dL — ABNORMAL LOW (ref 3.5–5.0)
Alkaline Phosphatase: 119 U/L (ref 38–126)
Anion gap: 9 (ref 5–15)
BUN: 14 mg/dL (ref 6–20)
CO2: 22 mmol/L (ref 22–32)
Calcium: 8.2 mg/dL — ABNORMAL LOW (ref 8.9–10.3)
Chloride: 109 mmol/L (ref 98–111)
Creatinine, Ser: 1.05 mg/dL (ref 0.61–1.24)
GFR, Estimated: >60 mL/min (ref >60)
Glucose, Bld: 114 mg/dL — ABNORMAL HIGH (ref 70–99)
Potassium: 3.4 mmol/L — ABNORMAL LOW (ref 3.5–5.1)
Sodium: 140 mmol/L (ref 135–145)
Total Bilirubin: 1.8 mg/dL — ABNORMAL HIGH (ref 0.3–1.2)
Total Protein: 6.9 g/dL (ref 6.5–8.1)

## 2021-10-30 LAB — PROTIME-INR
INR: 1.5 — ABNORMAL HIGH (ref 0.8–1.2)
Prothrombin Time: 17.9 seconds — ABNORMAL HIGH (ref 11.4–15.2)

## 2021-10-30 LAB — GLUCOSE, CAPILLARY
Glucose-Capillary: 111 mg/dL — ABNORMAL HIGH (ref 70–99)
Glucose-Capillary: 112 mg/dL — ABNORMAL HIGH (ref 70–99)
Glucose-Capillary: 119 mg/dL — ABNORMAL HIGH (ref 70–99)
Glucose-Capillary: 159 mg/dL — ABNORMAL HIGH (ref 70–99)

## 2021-10-30 LAB — HEPATITIS PANEL, ACUTE
HCV Ab: NONREACTIVE
Hep A IgM: NONREACTIVE
Hep B C IgM: NONREACTIVE
Hepatitis B Surface Ag: NONREACTIVE

## 2021-10-30 LAB — SARS CORONAVIRUS 2 BY RT PCR: SARS Coronavirus 2 by RT PCR: NEGATIVE

## 2021-10-30 LAB — LACTATE DEHYDROGENASE: LDH: 715 U/L — ABNORMAL HIGH (ref 98–192)

## 2021-10-30 LAB — FERRITIN: Ferritin: 52 ng/mL (ref 24–336)

## 2021-10-30 LAB — ETHANOL: Alcohol, Ethyl (B): <10 mg/dL (ref <10)

## 2021-10-30 LAB — LIPASE, BLOOD: Lipase: 27 U/L (ref 11–51)

## 2021-10-30 LAB — MAGNESIUM: Magnesium: 2 mg/dL (ref 1.7–2.4)

## 2021-10-30 LAB — BRAIN NATRIURETIC PEPTIDE: B Natriuretic Peptide: 559.6 pg/mL — ABNORMAL HIGH (ref 0.0–100.0)

## 2021-10-30 LAB — TSH: TSH: 2.48 u[IU]/mL (ref 0.350–4.500)

## 2021-10-30 LAB — ACETAMINOPHEN LEVEL: Acetaminophen (Tylenol), Serum: <10 ug/mL — ABNORMAL LOW (ref 10–30)

## 2021-10-30 LAB — BILIRUBIN, DIRECT: Bilirubin, Direct: 0.6 mg/dL — ABNORMAL HIGH (ref 0.0–0.2)

## 2021-10-30 LAB — LACTIC ACID, PLASMA: Lactic Acid, Venous: 1.1 mmol/L (ref 0.5–1.9)

## 2021-10-30 LAB — CK: Total CK: 146 U/L (ref 49–397)

## 2021-10-30 LAB — GAMMA GT: GGT: 152 U/L — ABNORMAL HIGH (ref 7–50)

## 2021-10-30 MED ORDER — ONDANSETRON HCL 4 MG/2ML IJ SOLN: 4.0000 mg | Freq: Four times a day (QID) | INTRAMUSCULAR | Status: DC | PRN

## 2021-10-30 MED ORDER — INSULIN ASPART 100 UNIT/ML IJ SOLN
0.0000 [IU] | Freq: Four times a day (QID) | INTRAMUSCULAR | Status: DC
  Administered 2021-10-30 – 2021-10-31 (×2): 1 [IU] via SUBCUTANEOUS

## 2021-10-30 MED ORDER — NALOXONE HCL 0.4 MG/ML IJ SOLN: 0.4000 mg | INTRAMUSCULAR | Status: DC | PRN

## 2021-10-30 MED ORDER — PANTOPRAZOLE SODIUM 40 MG IV SOLR
40.0000 mg | Freq: Two times a day (BID) | INTRAVENOUS | Status: DC
  Administered 2021-10-30 – 2021-11-01 (×5): 40 mg via INTRAVENOUS
  Filled 2021-10-30 (×5): qty 10

## 2021-10-30 MED ORDER — POLYETHYLENE GLYCOL 3350 17 G PO PACK
17.0000 g | PACK | Freq: Two times a day (BID) | ORAL | Status: DC
  Administered 2021-10-30 (×2): 17 g via ORAL
  Filled 2021-10-30 (×2): qty 1

## 2021-10-30 MED ORDER — LORAZEPAM 2 MG/ML IJ SOLN
0.5000 mg | Freq: Four times a day (QID) | INTRAMUSCULAR | Status: DC | PRN
  Administered 2021-10-30 – 2021-10-31 (×2): 0.5 mg via INTRAVENOUS
  Filled 2021-10-30 (×2): qty 1

## 2021-10-30 MED ORDER — POTASSIUM CHLORIDE CRYS ER 20 MEQ PO TBCR
40.0000 meq | EXTENDED_RELEASE_TABLET | Freq: Once | ORAL | Status: AC
  Administered 2021-10-30: 40 meq via ORAL
  Filled 2021-10-30: qty 2

## 2021-10-30 MED ORDER — MENTHOL 3 MG MT LOZG
1.0000 | LOZENGE | OROMUCOSAL | Status: DC | PRN
  Filled 2021-10-30: qty 9

## 2021-10-30 MED ORDER — POTASSIUM CHLORIDE 10 MEQ/100ML IV SOLN
10.0000 meq | INTRAVENOUS | Status: AC
  Administered 2021-10-30 (×2): 10 meq via INTRAVENOUS
  Filled 2021-10-30 (×2): qty 100

## 2021-10-30 MED ORDER — SENNOSIDES-DOCUSATE SODIUM 8.6-50 MG PO TABS
1.0000 | ORAL_TABLET | Freq: Two times a day (BID) | ORAL | Status: DC
  Administered 2021-10-30 – 2021-11-01 (×5): 1 via ORAL
  Filled 2021-10-30 (×5): qty 1

## 2021-10-30 MED ORDER — ACETAMINOPHEN 650 MG RE SUPP: 650.0000 mg | Freq: Four times a day (QID) | RECTAL | Status: DC | PRN

## 2021-10-30 MED ORDER — HYDROMORPHONE HCL 1 MG/ML IJ SOLN
0.5000 mg | INTRAMUSCULAR | Status: DC | PRN
  Administered 2021-10-30 – 2021-10-31 (×6): 0.5 mg via INTRAVENOUS
  Filled 2021-10-30 (×6): qty 0.5

## 2021-10-30 MED ORDER — ACETAMINOPHEN 325 MG PO TABS
650.0000 mg | ORAL_TABLET | Freq: Four times a day (QID) | ORAL | Status: DC | PRN
  Administered 2021-10-31: 650 mg via ORAL
  Filled 2021-10-30: qty 2

## 2021-10-30 NOTE — TOC Progression Note (Signed)
Transition of Care [facility]) - Progression Note    Patient Details  Name: Gilbert Graham MRN: [id number] Date of Birth: 1991-01-16  Transition of Care [facility]) CM/SW Contact  Purcell Mouton, RN Phone Number: 10/30/2021, 11:17 AM  Clinical Narrative:     Spoke with pt concerning PCP and insurance. Pt states that he has [employer], an appointment on June 21,2023 with Dr. Gaye Pollack in [place], [place]. There are no other needs at present time.   Expected Discharge Plan: Home/Self Care Barriers to Discharge: No Barriers Identified  Expected Discharge Plan and Services Expected Discharge Plan: Home/Self Care       Living arrangements for the past 2 months: Single Family Home                                       Social Determinants of Health (SDOH) Interventions    Readmission Risk Interventions     View : No data to display.

## 2021-10-30 NOTE — ED Notes (Signed)
ED TO INPATIENT HANDOFF REPORT  ED Nurse Name and Phone #:  Cristy Friedlander [phone]  S Name/Age/Gender Gilbert Graham 31 y.o. male Room/Bed: MH04/MH04  Code Status   Code Status: Not on file  Home/SNF/Other Home Patient oriented to: self, place, time, and situation Is this baseline? Yes   Triage Complete: Triage complete  Chief Complaint Elevated LFTs [R79.89]  Triage Note Pt reports right upper abdominal pain radiating to right back since Monday. Pain increasing  and has became constant. Reports projectile vomiting today x2 episode. Last BM 4 days ago  Given phenergan supp PTA   Allergies Allergies  Allergen Reactions   Amoxicillin    Imitrex [Sumatriptan]     Level of Care/Admitting Diagnosis ED Disposition     ED Disposition  Admit   Condition  --   Comment  Hospital Area: [facility] [[id number]]  Level of Care: Med-Surg [16]  Interfacility transfer: Yes  May place patient in observation at [facility] or [facility] if equivalent level of care is available:: Yes  Covid Evaluation: Asymptomatic - no recent exposure (last 10 days) testing not required  Diagnosis: Elevated LFTs [321910]  Admitting Physician: Briscoe Deutscher [[id number]]  Attending Physician: Briscoe Deutscher [[id number]]          B Medical/Surgery History Past Medical History:  Diagnosis Date   CHF (congestive heart failure) (HCC)    Diabetes mellitus without complication (HCC)    Scoliosis    Past Surgical History:  Procedure Laterality Date   TONSILLECTOMY     WISDOM TOOTH EXTRACTION       A IV Location/Drains/Wounds Patient Lines/Drains/Airways Status     Active Line/Drains/Airways     Name Placement date Placement time Site Days   Peripheral IV 10/29/21 20 G Left Antecubital 10/29/21  1755  Antecubital  1   Peripheral IV 10/29/21 20 G Anterior;Distal;Right;Upper Arm 10/29/21  2053  Arm  1            Intake/Output Last 24 hours  Intake/Output  Summary (Last 24 hours) at 10/30/2021 0046 Last data filed at 10/30/2021 0038 Gross per 24 hour  Intake 1051.3 ml  Output --  Net 1051.3 ml    Labs/Imaging Results for orders placed or performed during the hospital encounter of 10/29/21 (from the past 48 hour(s))  Lipase, blood     Status: Abnormal   Collection Time: 10/29/21  5:54 PM  Result Value Ref Range   Lipase 55 (H) 11 - 51 U/L    Comment: Performed at [facility], [address] [place], [place] [address]  Comprehensive metabolic panel     Status: Abnormal   Collection Time: 10/29/21  5:54 PM  Result Value Ref Range   Sodium 135 135 - 145 mmol/L   Potassium 3.4 (L) 3.5 - 5.1 mmol/L   Chloride 104 98 - 111 mmol/L   CO2 22 22 - 32 mmol/L   Glucose, Bld 214 (H) 70 - 99 mg/dL    Comment: Glucose reference range applies only to samples taken after fasting for at least 8 hours.   BUN 18 6 - 20 mg/dL   Creatinine, Ser 6.38 0.61 - 1.24 mg/dL   Calcium 8.2 (L) 8.9 - 10.3 mg/dL   Total Protein 7.0 6.5 - 8.1 g/dL   Albumin 3.5 3.5 - 5.0 g/dL   AST 4,665 (H) 15 - 41 U/L   ALT 1,682 (H) 0 - 44 U/L   Alkaline Phosphatase 133 (H)  38 - 126 U/L   Total Bilirubin 1.5 (H) 0.3 - 1.2 mg/dL   GFR, Estimated >51 >76 mL/min    Comment: (NOTE) Calculated using the CKD-EPI Creatinine Equation (2021)    Anion gap 9 5 - 15    Comment: Performed at [facility], [address] [place], [place] [address]  CBC     Status: Abnormal   Collection Time: 10/29/21  5:54 PM  Result Value Ref Range   WBC 13.2 (H) 4.0 - 10.5 K/uL   RBC 6.38 (H) 4.22 - 5.81 MIL/uL   Hemoglobin 13.7 13.0 - 17.0 g/dL   HCT 71.0 62.6 - 94.8 %   MCV 69.6 (L) 80.0 - 100.0 fL   MCH 21.5 (L) 26.0 - 34.0 pg   MCHC 30.9 30.0 - 36.0 g/dL   RDW 54.6 (H) 27.0 - 35.0 %   Platelets 301 150 - 400 K/uL   nRBC 1.1 (H) 0.0 - 0.2 %    Comment: Performed at [facility], [address] [place], [place] [address]  Urinalysis, Routine w reflex  microscopic Urine, Clean Catch     Status: Abnormal   Collection Time: 10/29/21  5:54 PM  Result Value Ref Range   Color, Urine YELLOW YELLOW   APPearance CLEAR CLEAR   Specific Gravity, Urine 1.020 1.005 - 1.030   pH 5.5 5.0 - 8.0   Glucose, UA >=500 (A) NEGATIVE mg/dL   Hgb urine dipstick NEGATIVE NEGATIVE   Bilirubin Urine NEGATIVE NEGATIVE   Ketones, ur NEGATIVE NEGATIVE mg/dL   Protein, ur 829 (A) NEGATIVE mg/dL   Nitrite NEGATIVE NEGATIVE   Leukocytes,Ua NEGATIVE NEGATIVE    Comment: Performed at [facility], [address] [place], [place] [address]  Urinalysis, Microscopic (reflex)     Status: Abnormal   Collection Time: 10/29/21  5:54 PM  Result Value Ref Range   RBC / HPF 0-5 0 - 5 RBC/hpf   WBC, UA NONE SEEN 0 - 5 WBC/hpf   Bacteria, UA RARE (A) NONE SEEN   Squamous Epithelial / LPF 0-5 0 - 5    Comment: Performed at [facility], [address] [place], [place] [address]  CBG monitoring, ED     Status: Abnormal   Collection Time: 10/29/21  5:57 PM  Result Value Ref Range   Glucose-Capillary 218 (H) 70 - 99 mg/dL    Comment: Glucose reference range applies only to samples taken after fasting for at least 8 hours.  Lactic acid, plasma     Status: None   Collection Time: 10/29/21  8:51 PM  Result Value Ref Range   Lactic Acid, Venous 1.5 0.5 - 1.9 mmol/L    Comment: Performed at [facility], [address] [place], [place] [address]  Protime-INR     Status: Abnormal   Collection Time: 10/29/21  8:51 PM  Result Value Ref Range   Prothrombin Time 18.4 (H) 11.4 - 15.2 seconds   INR 1.5 (H) 0.8 - 1.2    Comment: (NOTE) INR goal varies based on device and disease states. Performed at [facility], [address] [place], [place] [address]   APTT     Status: None   Collection Time: 10/29/21  8:51 PM  Result Value Ref Range   aPTT 31 24 - 36 seconds    Comment: Performed at [facility], [address] [place],  [place] [address]  Lactic acid, plasma     Status: None   Collection Time: 10/29/21 11:49 PM  Result Value Ref Range   Lactic Acid, Venous 1.1 0.5 - 1.9 mmol/L    Comment: Performed at [facility], [address] [place], [place] [address]   CT Abdomen Pelvis W Contrast  Result Date: 10/29/2021 CLINICAL DATA:  Nausea/vomiting RUQ pain. 31 y/o male. Pt reports right upper abdominal pain radiating to right back since Monday. Pain increasing and has became constant. Reports projectile vomiting today x2 episode. EXAM: CT ABDOMEN AND PELVIS WITH CONTRAST TECHNIQUE: Multidetector CT imaging of the abdomen and pelvis was performed using the standard protocol following bolus administration of intravenous contrast. RADIATION DOSE REDUCTION: This exam was performed according to the departmental dose-optimization program which includes automated exposure control, adjustment of the mA and/or kV according to patient size and/or use of iterative reconstruction technique. CONTRAST:  OMNIPAQUE IOHEXOL 300 MG/ML  SOLN COMPARISON:  None Available. FINDINGS: Lower chest: Prominent cardiac silhouette. Mild peribronchovascular ground-glass airspace opacity. No acute abnormality. Hepatobiliary: No focal liver abnormality. No gallstones, gallbladder wall thickening, or pericholecystic fluid. No biliary dilatation. Pancreas: No focal lesion. Normal pancreatic contour. No surrounding inflammatory changes. No main pancreatic ductal dilatation. Spleen: Normal in size without focal abnormality. Adrenals/Urinary Tract: No adrenal nodule bilaterally. Bilateral kidneys enhance symmetrically. No hydronephrosis. No hydroureter. The urinary bladder is unremarkable. Stomach/Bowel: Stomach is within normal limits. No evidence of bowel wall thickening or dilatation. Appendix appears normal. Vascular/Lymphatic: No abdominal aorta or iliac aneurysm. No abdominal, pelvic, or inguinal lymphadenopathy. Reproductive:  Prostate is unremarkable. Other: No intraperitoneal free fluid. No intraperitoneal free gas. No organized fluid collection. Musculoskeletal: Subcutaneus soft tissue edema.  No hernia. No suspicious lytic or blastic osseous lesions. No acute displaced fracture. Multilevel degenerative changes of the spine. IMPRESSION: 1. No acute intra-abdominal or intrapelvic abnormality. 2. Mild pulmonary edema in the setting of cardiomegaly. Electronically Signed   By: Tish Frederickson M.D.   On: 10/29/2021 22:27   DG Chest Port 1 View  Result Date: 10/29/2021 CLINICAL DATA:  Upper abdominal pain EXAM: PORTABLE CHEST 1 VIEW COMPARISON:  None Available. FINDINGS: Lungs are clear.  No pleural effusion or pneumothorax. Cardiomegaly. IMPRESSION: Cardiomegaly.  No evidence of acute cardiopulmonary disease. Electronically Signed   By: Charline Bills M.D.   On: 10/29/2021 21:17   US Abdomen Limited RUQ (LIVER/GB)  Result Date: 10/29/2021 CLINICAL DATA:  Pain EXAM: ULTRASOUND ABDOMEN LIMITED RIGHT UPPER QUADRANT COMPARISON:  None Available. FINDINGS: Gallbladder: No gallstones or wall thickening visualized. No sonographic Murphy sign noted by sonographer. Common bile duct: Diameter: 4 mm Liver: Within the upper limits of normal for parenchymal echotexture. No focal hepatic lesion is seen. Portal vein is patent on color Doppler imaging with normal direction of blood flow towards the liver. Other: None. IMPRESSION: Negative right upper quadrant ultrasound. Electronically Signed   By: Charline Bills M.D.   On: 10/29/2021 21:59    Pending Labs Unresulted Labs (From admission, onward)     Start     Ordered   10/29/21 2333  Hepatitis panel, acute  Once,   URGENT        10/29/21 2332   10/29/21 2036  Blood Culture (routine x 2)  (Undifferentiated presentation (screening labs and basic nursing orders))  BLOOD CULTURE X 2,   STAT      10/29/21 2040            Vitals/Pain Today's Vitals  10/29/21 2130 10/29/21 2136  10/29/21 2154 10/29/21 2222  BP: 101/74   116/87  Pulse: 99   94  Resp: 18   (!) 24  Temp:      TempSrc:      SpO2: 96%   99%  Weight:   (!) 142.6 kg   Height:      PainSc:  7       Isolation Precautions No active isolations  Medications Medications  0.9 %  sodium chloride infusion ( Intravenous New Bag/Given 10/29/21 2057)  sodium chloride 0.9 % bolus 1,000 mL ( Intravenous Stopped 10/29/21 2219)  morphine (PF) 4 MG/ML injection 4 mg (4 mg Intravenous Given 10/29/21 2059)  ondansetron (ZOFRAN) injection 4 mg (4 mg Intravenous Given 10/29/21 2059)  piperacillin-tazobactam (ZOSYN) IVPB 3.375 g (0 g Intravenous Stopped 10/29/21 2128)  iohexol (OMNIPAQUE) 300 MG/ML solution 125 mL (125 mLs Intravenous Contrast Given 10/29/21 2159)    Mobility Walks with cane sometimes Low fall risk   Focused Assessments Elevated liver Functions   R Recommendations: See Admitting Provider Note  Report given to:   Additional Notes:  20 left ac, 20 right upper arm, both locked at this time expresses no needs. Cardiac monitor.

## 2021-10-30 NOTE — Assessment & Plan Note (Signed)
 #)   Chronic systolic heart failure: documented history of such, although initial chart review has not yet revealed any previous echocardiogram results.  No clinical or radiographic evidence to suggest acute decompensated heart failure at this time, including chest x-ray showing no evidence of edema, effusion, infiltrate.  Does not appear to be any scheduled diuretic medications at home.   Plan: monitor strict I's & O's and daily weights. Repeat CMP in AM. Check serum mag level.  Add on BNP. Will attempt additional chart review to locate any previous echocardiogram results.

## 2021-10-30 NOTE — H&P (Signed)
History and Physical    PLEASE NOTE THAT DRAGON DICTATION SOFTWARE WAS USED IN THE CONSTRUCTION OF THIS NOTE.   Gilbert Graham GDJ:[id number] DOB: [birth year] DOA: 10/29/2021  PCP: Gilbert Graham (will further assess) Patient coming from: home   I have personally briefly reviewed patient's old medical records in [place]  Chief Complaint: Abdominal pain  HPI: Gilbert Graham is a 31 y.o. male with medical history significant for chronic systolic heart failure, type 2 diabetes mellitus with most recent hemoglobin A1c noted to be 12.3% in August 2022, who is admitted to [facility] on 10/29/2021 by way of transfer from [place] emergency department with acute transaminitis after presenting from home to the latter facility complaining of abdominal pain.  The patient reports 2 days of sharp abdominal discomfort, which she states is located in the right lower abdominal quadrant, with some radiation to the right upper quadrant in addition to lateral radiation into the flank.  He notes that this pain has been persistent over that timeframe, worsening with palpation over the abdomen as well as with cough.  He notes associated intermittent nausea resulting in at least 2-3 episodes of nonbloody, nonbilious emesis over the course of the last 1 to 2 days, noting most recent episode of emesis occurring just prior to presentation to [place] emergency department on 10/29/2021.   He states that this abdominal discomfort as well as nausea/vomiting was preceded by approximately 1 week of shortness of breath and new onset nonproductive cough associated with subjective fever/chills.  He notes interval resolution of his shortness of breath as well as the associated subjective fever/chills, while noting some persistence of his nonproductive cough.  Graham recent trauma or travel.  Denies any recent diarrhea, melena, hematochezia, rash.  He also denies any recent dysuria or gross hematuria.   Graham associated any headache or neck stiffness.  Graham recent chest pain, diaphoresis, palpitations, dizziness, presyncope, or syncope.  He has never experienced abdominal pain similar to that with which he presents this evening.  Denies any history of regular or recent alcohol consumption, nor any history of recreational drug use.  Per chart review, most recent set of liver enzymes appear to have been checked in August 2022, and were notable for the following: Alkaline phosphatase 67, AST 25, ALT 17, total bilirubin 0.5.     [facility] ED Course:  Vital signs in the ED were notable for the following: Afebrile; heart rate initially 105, which decreased to 91 following interval administration of IV fluids; blood pressure 115/84; respiratory rate 16-22, oxygen saturation 9500% on room air.  Labs were notable for the following: CMP notable for the following: Potassium 3.4, bicarbonate 22, anion gap 9, creatinine 1.14, glucose 214, calcium, corrected for mild hypoalbuminemia noted to be 8.6, albumin 3.5, alkaline phosphatase 133, AST 1744, ALT 1682, total bilirubin 1.5.  Lipase 55.  Initial lactic acid 1.5 with repeat value trending down to 1.1.  CBC notable for white blood cell count 13,200.  INR 1.2.  Urinalysis notable for Graham white blood cells.  Acute viral hepatitis panel ordered, with result currently pending.  Blood cultures x2 collected prior to initiation of IV antibiotics in the ED.  Imaging and additional notable ED work-up: EKG shows sinus tachycardia with heart rate 103, prolonged QTc of 523, and Graham evidence of T wave or ST changes, including Graham evidence of ST elevation.  Chest x-ray showed cardiomegaly without evidence of acute cardiopulmonary process, including Graham  evidence of infiltrate, edema, effusion, or pneumothorax.  CT abdomen/pelvis showed Graham evidence of acute intra-abdominal or acute intrapelvic process, including Graham evidence of acute focal liver abnormality, while showing Graham  evidence of gallbladder wall thickening, pericholecystic fluid, gallstones, common bile duct dilation, choledocholithiasis, and Graham evidence of acute pancreatitis.  Right upper quadrant ultrasound also showed Graham evidence of acute process.   EDP requested consultation from Gilbert Graham for further recommendations regarding presenting acute transaminitis.  While in the ED, the following were administered: Morphine 4 mg IV x1, Zofran 4 mg IV x1, normal saline x1 L bolus, Zosyn.  Subsequently, the patient was transferred from [place] ED to [facility] for further evaluation management of presenting acute transaminitis, with presentation also notable for mild hypokalemia as well as prolonged QTc.    Review of Systems: As per HPI otherwise 10 point review of systems negative.   Past Medical History:  Diagnosis Date   CHF (congestive heart failure) (HCC)    Diabetes mellitus without complication ([place])    Scoliosis     Past Surgical History:  Procedure Laterality Date   TONSILLECTOMY     WISDOM TOOTH EXTRACTION      Social History:  reports that he has been smoking cigars. He has never used smokeless tobacco. He reports that he does not drink alcohol and does not use drugs.   Allergies  Allergen Reactions   Amoxicillin    Imitrex [Sumatriptan]     Family History  Problem Relation Age of Onset   Asthma Mother    Heart failure Mother    Fibromyalgia Mother    Diabetes Father    Coronary artery disease Father     Family history reviewed and not pertinent    Outpatient medications: The patient denies use of any scheduled or prn medications, supplements, as an outpatient.   Objective    Physical Exam: Vitals:   10/29/21 2154 10/29/21 2222 10/30/21 0100 10/30/21 0216  BP:  116/87 115/84 112/83  Pulse:  94 92 94  Resp:  (!) 24 (!) 24 16  Temp:    98.2 F (36.8 C)  TempSrc:    Oral  SpO2:  99% 95% 97%  Weight: (!) 142.6 kg     Height:        General:  appears to be stated age; alert, oriented Skin: warm, dry, Graham rash Head:  AT/[place] Mouth:  Oral mucosa membranes appear dry, normal dentition Neck: supple; trachea midline Heart:  RRR; did not appreciate any M/R/G Lungs: CTAB, did not appreciate any wheezes, rales, or rhonchi Abdomen: + BS; soft, ND, generalized tenderness to palpation in the absence of any associated guarding, rigidity, or rebound tenderness Vascular: 2+ pedal pulses b/l; 2+ radial pulses b/l Extremities: Graham peripheral edema, Graham muscle wasting Neuro: strength and sensation intact in upper and lower extremities b/l     Labs on Admission: I have personally reviewed following labs and imaging studies  CBC: Recent Labs  Lab 10/29/21 1754  WBC 13.2*  HGB 13.7  HCT 44.4  MCV 69.6*  PLT 284   Basic Metabolic Panel: Recent Labs  Lab 10/29/21 1754  NA 135  K 3.4*  CL 104  CO2 22  GLUCOSE 214*  BUN 18  CREATININE 1.14  CALCIUM 8.2*   GFR: Estimated Creatinine Clearance: 126.6 mL/min (by C-G formula based on SCr of 1.14 mg/dL). Liver Function Tests: Recent Labs  Lab 10/29/21 1754  AST 1,744*  ALT 1,682*  ALKPHOS 133*  BILITOT 1.5*  PROT 7.0  ALBUMIN 3.5   Recent Labs  Lab 10/29/21 1754  LIPASE 55*   Graham results for input(s): AMMONIA in the last 168 hours. Coagulation Profile: Recent Labs  Lab 10/29/21 2051  INR 1.5*   Cardiac Enzymes: Graham results for input(s): CKTOTAL, CKMB, CKMBINDEX, TROPONINI in the last 168 hours. BNP (last 3 results) Graham results for input(s): PROBNP in the last 8760 hours. HbA1C: Graham results for input(s): HGBA1C in the last 72 hours. CBG: Recent Labs  Lab 10/29/21 1757  GLUCAP 218*   Lipid Profile: Graham results for input(s): CHOL, HDL, LDLCALC, TRIG, CHOLHDL, LDLDIRECT in the last 72 hours. Thyroid Function Tests: Graham results for input(s): TSH, T4TOTAL, FREET4, T3FREE, THYROIDAB in the last 72 hours. Anemia Panel: Graham results for input(s): VITAMINB12, FOLATE,  FERRITIN, TIBC, IRON, RETICCTPCT in the last 72 hours. Urine analysis:    Component Value Date/Time   COLORURINE YELLOW 10/29/2021 [place] 10/29/2021 1754   LABSPEC 1.020 10/29/2021 1754   PHURINE 5.5 10/29/2021 1754   GLUCOSEU >=500 (A) 10/29/2021 1754   HGBUR NEGATIVE 10/29/2021 1754   BILIRUBINUR NEGATIVE 10/29/2021 1754   KETONESUR NEGATIVE 10/29/2021 1754   PROTEINUR 100 (A) 10/29/2021 1754   NITRITE NEGATIVE 10/29/2021 1754   LEUKOCYTESUR NEGATIVE 10/29/2021 1754    Radiological Exams on Admission: CT Abdomen Pelvis W Contrast  Result Date: 10/29/2021 CLINICAL DATA:  Nausea/vomiting RUQ pain. 31 y/o male. Pt reports right upper abdominal pain radiating to right back since Monday. Pain increasing and has became constant. Reports projectile vomiting today x2 episode. EXAM: CT ABDOMEN AND PELVIS WITH CONTRAST TECHNIQUE: Multidetector CT imaging of the abdomen and pelvis was performed using the standard protocol following bolus administration of intravenous contrast. RADIATION DOSE REDUCTION: This exam was performed according to the departmental dose-optimization program which includes automated exposure control, adjustment of the mA and/or kV according to patient size and/or use of iterative reconstruction technique. CONTRAST:  180m OMNIPAQUE IOHEXOL 300 MG/ML  SOLN COMPARISON:  None Available. FINDINGS: Lower chest: Prominent cardiac silhouette. Mild peribronchovascular ground-glass airspace opacity. Graham acute abnormality. Hepatobiliary: Graham focal liver abnormality. Graham gallstones, gallbladder wall thickening, or pericholecystic fluid. Graham biliary dilatation. Pancreas: Graham focal lesion. Normal pancreatic contour. Graham surrounding inflammatory changes. Graham main pancreatic ductal dilatation. Spleen: Normal in size without focal abnormality. Adrenals/Urinary Tract: Graham adrenal nodule bilaterally. Bilateral kidneys enhance symmetrically. Graham hydronephrosis. Graham hydroureter. The urinary  bladder is unremarkable. Stomach/Bowel: Stomach is within normal limits. Graham evidence of bowel wall thickening or dilatation. Appendix appears normal. Vascular/Lymphatic: Graham abdominal aorta or iliac aneurysm. Graham abdominal, pelvic, or inguinal lymphadenopathy. Reproductive: Prostate is unremarkable. Other: Graham intraperitoneal free fluid. Graham intraperitoneal free gas. Graham organized fluid collection. Musculoskeletal: Subcutaneus soft tissue edema.  Graham hernia. Graham suspicious lytic or blastic osseous lesions. Graham acute displaced fracture. Multilevel degenerative changes of the spine. IMPRESSION: 1. Graham acute intra-abdominal or intrapelvic abnormality. 2. Mild pulmonary edema in the setting of cardiomegaly. Electronically Signed   By: MIven FinnM.D.   On: 10/29/2021 22:27   DG Chest Port 1 View  Result Date: 10/29/2021 CLINICAL DATA:  Upper abdominal pain EXAM: PORTABLE CHEST 1 VIEW COMPARISON:  None Available. FINDINGS: Lungs are clear.  Graham pleural effusion or pneumothorax. Cardiomegaly. IMPRESSION: Cardiomegaly.  Graham evidence of acute cardiopulmonary disease. Electronically Signed   By: SJulian HyM.D.   On: 10/29/2021 21:17   UKoreaAbdomen Limited RUQ (LIVER/GB)  Result Date: 10/29/2021 CLINICAL DATA:  Pain EXAM:  ULTRASOUND ABDOMEN LIMITED RIGHT UPPER QUADRANT COMPARISON:  None Available. FINDINGS: Gallbladder: Graham gallstones or wall thickening visualized. Graham sonographic Murphy sign noted by sonographer. Common bile duct: Diameter: 4 mm Liver: Within the upper limits of normal for parenchymal echotexture. Graham focal hepatic lesion is seen. Portal vein is patent on color Doppler imaging with normal direction of blood flow towards the liver. Other: None. IMPRESSION: Negative right upper quadrant ultrasound. Electronically Signed   By: Julian Hy M.D.   On: 10/29/2021 21:59     EKG: Independently reviewed, with result as described above.    Assessment/Plan    Principal Problem:   Elevated  LFTs Active Problems:   Hypokalemia   Obesity, Class III, BMI 40-49.9 (morbid obesity) (HCC)   Prolonged QT interval   Chronic systolic CHF (congestive heart failure) (HCC)   DM2 (diabetes mellitus, type 2) (HCC)       #) Acute transaminitis: In the setting of 2 days of right lower quadrant abdominal discomfort associated with nausea/vomiting, presenting labs suggestive of acute transaminitis, without associated overt cholestatic, given minimal elevation of alkaline phosphatase as well as total bilirubin, while CT abdomen/pelvis as well as right upper quadrant ultrasound demonstrate Graham evidence of acute intra-abdominal process, including Graham evidence of intrinsic/extrinsic biliary obstruction, as further detailed above.  Clinically, criteria for ascending cholangitis not currently met, particular in the absence of any associated fever or jaundice.  Overall, Graham overt evidence of underlying infectious process at this time.  Additionally, Graham evidence of hypotension or altered mental status at this time.  Consequently, will refrain from administration of additional IV antibiotics for now.  Etiology for patient's acute transaminitis not entirely clear at this time.  Given a nearly 25-monthhiatus in liver enzyme results, differential nonalcoholic fatty liver disease, given patient's BMI greater than 50.  Denies any history of recurrent or recent alcohol consumption, although we will check serum ethanol level as well as urinary drug screen.  Given preceding shortness of breath/cough, will also check for any evidence of recent viral infection, including COVID-19 PCR as well as influenza PCR, which may have contributed to the patient's transaminitis.  Acute viral hepatitis panel results currently pending.  The setting of a reported history of chronic systolic heart failure, will also add on BNP to evaluate for any contribution from congestive hepatopathy, particularly given the patient's body habitus.  Of  note[facility], with additional recommendations currently pending, as further detailed above.    Plan: Graham consulted, as above.   Repeat CMP in the morning.  Check GGT and direct bilirubin.  Repeat INR.  Follow-up result of acute viral hepatitis panel.  N.p.o. for now, pending additional Graham recommendations.  Prn IV Dilaudid.  As needed IV Ativan.  Add on serum ethanol level, urinary drug screen.  Check LDH.  Check acetaminophen level as well as ferritin level.  Check ceruloplasmin level.  Check COVID-19 PCR/influenza PCR.  Repeat lipase.  Add on BNP.         #) Hypokalemia: Presenting serum potassium level 3.4, with likely contribution from recent increase in Graham losses in the form of nausea/vomiting.  Plan: Potassium chloride 20 mill equivalents IV over 2 hours x 1 dose now.  Add on serum magnesium level.  Repeat CMP in the morning.           #) Morbid obesity: In the context of presenting BMI greater than 40, with most recent BMI specifically calculated to be 50.75,  criteria met for morbid obesity.  Notable in the context of patient's acute transaminitis, of unclear etiology at this time.  Plan: Check TSH, hemoglobin A1c.  Repeat C MP in the morning.  Further evaluation management of presenting acute transaminitis, as above.             #) QTc prolongation: Presenting EKG demonstrates QTc of 523 ms. Graham overt outpatient medications commonly associated with QTc prolongation.  Plan: Monitor on telemetry.  Add-on serum magnesium level.  Repeat EKG in the morning to monitor interval degree of QTc prolongation.                 #) Chronic systolic heart failure: documented history of such, although initial chart review has not yet revealed any previous echocardiogram results.  Graham clinical or radiographic evidence to suggest acute decompensated heart failure at this time, including chest x-ray showing Graham  evidence of edema, effusion, infiltrate.  Does not appear to be any scheduled diuretic medications at home.   Plan: monitor strict I's & O's and daily weights. Repeat CMP in AM. Check serum mag level.  Add on BNP. Will attempt additional chart review to locate any previous echocardiogram results.           #) Type 2 Diabetes Mellitus: documented history of such.  Appears to be managed via lifestyle modifications .home insulin regimen: None. Home oral hypoglycemic agents: None. presenting blood sugar: 214.  Overall, glycemic control as an outpatient appears suboptimally controlled, with most recent hemoglobin A1c noted to be 12.3% on 01/03/2021.   Plan: In the setting of current n.p.o. status, I ordered Accu-Cheks every 6 hours with low-dose high scale insulin.  Check hemoglobin A1c.         DVT prophylaxis: SCD's   Code Status: Full code Family Communication: none Disposition Plan: Per Rounding Team Consults called: Consult request placed via [place] with [facility] gastroenterology, as further detailed above. Admission status: Observation   PLEASE NOTE THAT DRAGON DICTATION SOFTWARE WAS USED IN THE CONSTRUCTION OF THIS NOTE.   Elizabeth DO [facility]   10/30/2021, 5:40 AM

## 2021-10-30 NOTE — Assessment & Plan Note (Signed)
#)   Hypokalemia: Presenting serum potassium level 3.4, with likely contribution from recent increase in GI losses in the form of nausea/vomiting.  Plan: Potassium chloride 20 mill equivalents IV over 2 hours x 1 dose now.  Add on serum magnesium level.  Repeat CMP in the morning.

## 2021-10-30 NOTE — Assessment & Plan Note (Signed)
  #)   QTc prolongation: Presenting EKG demonstrates QTc of 523 ms. no overt outpatient medications commonly associated with QTc prolongation.  Plan: Monitor on telemetry.  Add-on serum magnesium level.  Repeat EKG in the morning to monitor interval degree of QTc prolongation.

## 2021-10-30 NOTE — Progress Notes (Signed)
PROGRESS NOTE    Gilbert Graham  NTZ:[id number] DOB: May 17, 1991 DOA: 10/29/2021 PCP: Oneita Hurt, No   Brief Narrative: 31 year old with past medical history significant for chronic systolic heart failure, diabetes type 2, most recent A1c 12.01 January 2021 admitted to [facility] on 5/31 complaining of abdominal pain and acute transaminitis. He reported shortness of breath a few days prior to admission that has resolved.    Assessment & Plan:   Principal Problem:   Elevated LFTs Active Problems:   Hypokalemia   Obesity, Class III, BMI 40-49.9 (morbid obesity) (HCC)   Prolonged QT interval   Chronic systolic CHF (congestive heart failure) (HCC)   DM2 (diabetes mellitus, type 2) (HCC)   1-Acute transaminitis: Unclear etiology. Shock liver ?  Hepatitis panel non reactive.  TSH normal. CK level normal.  Abdominal US; Negative right upper quadrant [place]  GI consulted.  Tylenol negative.  LFT trending down.   Hypokalemia: Replaced.   Morbid Obesity: Needs life style modifications.   QT prolong: replete potassium. Check EKG in am.   Chronic Systolic  Heart failure:  ECHO: 2018: EF 55 % He report he was diagnosed with systolic HFF in [place] 2021.  Plan to check ECHO.   Diabetes Type 2:  Checking A1c  SSI.   Right lower Quadrant abdominal pain;  No BM in 1 week.  KUB negative for obstruction.  Start Bowel regimen.    Estimated body mass index is 51.13 kg/m as calculated from the following:   Height as of this encounter: 5\' 6"  (1.676 m).   Weight as of this encounter: 143.7 kg.   DVT prophylaxis: SCD Code Status: Full code Family Communication: care discussed with patient.  Disposition Plan:  Status is: Observation The patient remains OBS appropriate and will d/c before 2 midnights.    Consultants:  GI  Procedures:  Abdominal US  Antimicrobials:    Subjective: He is alert, report right lower quadrant pain. No BM in 1 week.  Denies dyspnea.    Objective: Vitals:   10/30/21 0100 10/30/21 0216 10/30/21 0500 10/30/21 0545  BP: 115/84 112/83  128/90  Pulse: 92 94  87  Resp: (!) 24 16  (!) 22  Temp:  98.2 F (36.8 C)  98.1 F (36.7 C)  TempSrc:  Oral    SpO2: 95% 97%  93%  Weight:   (!) 143.7 kg   Height:        Intake/Output Summary (Last 24 hours) at 10/30/2021 0750 Last data filed at 10/30/2021 0654 Gross per 24 hour  Intake 1151.87 ml  Output --  Net 1151.87 ml   Filed Weights   10/29/21 1726 10/29/21 2154 10/30/21 0500  Weight: 129.7 kg (!) 142.6 kg (!) 143.7 kg    Examination:  General exam: Appears calm and comfortable  Respiratory system: Clear to auscultation. Respiratory effort normal. Cardiovascular system: S1 & S2 heard, RRR. No JVD, murmurs, rubs, gallops or clicks. No pedal edema. Gastrointestinal system: Abdomen is nondistended, soft and nontender. No organomegaly or masses felt. Normal bowel sounds heard. Central nervous system: Alert and oriented.  Extremities: Symmetric 5 x 5 power.     Data Reviewed: I have personally reviewed following labs and imaging studies  CBC: Recent Labs  Lab 10/29/21 1754 10/30/21 0559  WBC 13.2* 11.3*  NEUTROABS  --  7.3  HGB 13.7 12.6*  HCT 44.4 42.9  MCV 69.6* 72.5*  PLT 301 264   Basic Metabolic Panel: Recent Labs  Lab 10/29/21 1754 10/30/21 0559  NA  135 140  K 3.4* 3.4*  CL 104 109  CO2 22 22  GLUCOSE 214* 114*  BUN 18 14  CREATININE 1.14 1.05  CALCIUM 8.2* 8.2*  MG  --  2.0   GFR: Estimated Creatinine Clearance: 138.1 mL/min (by C-G formula based on SCr of 1.05 mg/dL). Liver Function Tests: Recent Labs  Lab 10/29/21 1754 10/30/21 0559  AST 1,744* 1,037*  ALT 1,682* 1,350*  ALKPHOS 133* 119  BILITOT 1.5* 1.8*  PROT 7.0 6.9  ALBUMIN 3.5 3.4*   Recent Labs  Lab 10/29/21 1754 10/30/21 0559  LIPASE 55* 27   No results for input(s): AMMONIA in the last 168 hours. Coagulation Profile: Recent Labs  Lab 10/29/21 2051  10/30/21 0559  INR 1.5* 1.5*   Cardiac Enzymes: No results for input(s): CKTOTAL, CKMB, CKMBINDEX, TROPONINI in the last 168 hours. BNP (last 3 results) No results for input(s): PROBNP in the last 8760 hours. HbA1C: No results for input(s): HGBA1C in the last 72 hours. CBG: Recent Labs  Lab 10/29/21 1757 10/30/21 0547  GLUCAP 218* 111*   Lipid Profile: No results for input(s): CHOL, HDL, LDLCALC, TRIG, CHOLHDL, LDLDIRECT in the last 72 hours. Thyroid Function Tests: No results for input(s): TSH, T4TOTAL, FREET4, T3FREE, THYROIDAB in the last 72 hours. Anemia Panel: Recent Labs    10/30/21 0559  FERRITIN 52   Sepsis Labs: Recent Labs  Lab 10/29/21 2051 10/29/21 2349  LATICACIDVEN 1.5 1.1    Recent Results (from the past 240 hour(s))  SARS Coronavirus 2 by RT PCR (hospital order, performed in [facility] hospital lab) *cepheid single result test* Anterior Nasal Swab     Status: None   Collection Time: 10/30/21  4:38 AM   Specimen: Anterior Nasal Swab  Result Value Ref Range Status   SARS Coronavirus 2 by RT PCR NEGATIVE NEGATIVE Final    Comment: (NOTE) SARS-CoV-2 target nucleic acids are NOT DETECTED.  The SARS-CoV-2 RNA is generally detectable in upper and lower respiratory specimens during the acute phase of infection. The lowest concentration of SARS-CoV-2 viral copies this assay can detect is 250 copies / mL. A negative result does not preclude SARS-CoV-2 infection and should not be used as the sole basis for treatment or other patient management decisions.  A negative result may occur with improper specimen collection / handling, submission of specimen other than nasopharyngeal swab, presence of viral mutation(s) within the areas targeted by this assay, and inadequate number of viral copies (<250 copies / mL). A negative result must be combined with clinical observations, patient history, and epidemiological information.  Fact Sheet for Patients:    RoadLapTop.co.za  Fact Sheet for Healthcare Providers: http://kim-miller.com/  This test is not yet approved or  cleared by the [place] FDA and has been authorized for detection and/or diagnosis of SARS-CoV-2 by FDA under an Emergency Use Authorization (EUA).  This EUA will remain in effect (meaning this test can be used) for the duration of the COVID-19 declaration under Section 564(b)(1) of the Act, 21 U.S.C. section 360bbb-3(b)(1), unless the authorization is terminated or revoked sooner.  Performed at [facility], [address], [place], [place] [address]   Respiratory (~20 pathogens) panel by PCR     Status: None   Collection Time: 10/30/21  4:38 AM   Specimen: Nasopharyngeal Swab; Respiratory  Result Value Ref Range Status   Adenovirus NOT DETECTED NOT DETECTED Final   Coronavirus 229E NOT DETECTED NOT DETECTED Final    Comment: (NOTE) The  Coronavirus on the Respiratory Panel, DOES NOT test for the novel  Coronavirus (2019 nCoV)    Coronavirus HKU1 NOT DETECTED NOT DETECTED Final   Coronavirus NL63 NOT DETECTED NOT DETECTED Final   Coronavirus OC43 NOT DETECTED NOT DETECTED Final   Metapneumovirus NOT DETECTED NOT DETECTED Final   Rhinovirus / Enterovirus NOT DETECTED NOT DETECTED Final   Influenza A NOT DETECTED NOT DETECTED Final   Influenza B NOT DETECTED NOT DETECTED Final   Parainfluenza Virus 1 NOT DETECTED NOT DETECTED Final   Parainfluenza Virus 2 NOT DETECTED NOT DETECTED Final   Parainfluenza Virus 3 NOT DETECTED NOT DETECTED Final   Parainfluenza Virus 4 NOT DETECTED NOT DETECTED Final   Respiratory Syncytial Virus NOT DETECTED NOT DETECTED Final   Bordetella pertussis NOT DETECTED NOT DETECTED Final   Bordetella Parapertussis NOT DETECTED NOT DETECTED Final   Chlamydophila pneumoniae NOT DETECTED NOT DETECTED Final   Mycoplasma pneumoniae NOT DETECTED NOT DETECTED Final    Comment:  Performed at [facility], [address] [place], [place] [address]         Radiology Studies: CT Abdomen Pelvis W Contrast  Result Date: 10/29/2021 CLINICAL DATA:  Nausea/vomiting RUQ pain. 31 y/o male. Pt reports right upper abdominal pain radiating to right back since Monday. Pain increasing and has became constant. Reports projectile vomiting today x2 episode. EXAM: CT ABDOMEN AND PELVIS WITH CONTRAST TECHNIQUE: Multidetector CT imaging of the abdomen and pelvis was performed using the standard protocol following bolus administration of intravenous contrast. RADIATION DOSE REDUCTION: This exam was performed according to the departmental dose-optimization program which includes automated exposure control, adjustment of the mA and/or kV according to patient size and/or use of iterative reconstruction technique. CONTRAST:  OMNIPAQUE IOHEXOL 300 MG/ML  SOLN COMPARISON:  None Available. FINDINGS: Lower chest: Prominent cardiac silhouette. Mild peribronchovascular ground-glass airspace opacity. No acute abnormality. Hepatobiliary: No focal liver abnormality. No gallstones, gallbladder wall thickening, or pericholecystic fluid. No biliary dilatation. Pancreas: No focal lesion. Normal pancreatic contour. No surrounding inflammatory changes. No main pancreatic ductal dilatation. Spleen: Normal in size without focal abnormality. Adrenals/Urinary Tract: No adrenal nodule bilaterally. Bilateral kidneys enhance symmetrically. No hydronephrosis. No hydroureter. The urinary bladder is unremarkable. Stomach/Bowel: Stomach is within normal limits. No evidence of bowel wall thickening or dilatation. Appendix appears normal. Vascular/Lymphatic: No abdominal aorta or iliac aneurysm. No abdominal, pelvic, or inguinal lymphadenopathy. Reproductive: Prostate is unremarkable. Other: No intraperitoneal free fluid. No intraperitoneal free gas. No organized fluid collection. Musculoskeletal: Subcutaneus soft tissue  edema.  No hernia. No suspicious lytic or blastic osseous lesions. No acute displaced fracture. Multilevel degenerative changes of the spine. IMPRESSION: 1. No acute intra-abdominal or intrapelvic abnormality. 2. Mild pulmonary edema in the setting of cardiomegaly. Electronically Signed   By: Tish Frederickson M.D.   On: 10/29/2021 22:27   DG Chest Port 1 View  Result Date: 10/29/2021 CLINICAL DATA:  Upper abdominal pain EXAM: PORTABLE CHEST 1 VIEW COMPARISON:  None Available. FINDINGS: Lungs are clear.  No pleural effusion or pneumothorax. Cardiomegaly. IMPRESSION: Cardiomegaly.  No evidence of acute cardiopulmonary disease. Electronically Signed   By: Charline Bills M.D.   On: 10/29/2021 21:17   US Abdomen Limited RUQ (LIVER/GB)  Result Date: 10/29/2021 CLINICAL DATA:  Pain EXAM: ULTRASOUND ABDOMEN LIMITED RIGHT UPPER QUADRANT COMPARISON:  None Available. FINDINGS: Gallbladder: No gallstones or wall thickening visualized. No sonographic Murphy sign noted by sonographer. Common bile duct: Diameter: 4 mm Liver: Within the upper limits of normal  for parenchymal echotexture. No focal hepatic lesion is seen. Portal vein is patent on color Doppler imaging with normal direction of blood flow towards the liver. Other: None. IMPRESSION: Negative right upper quadrant ultrasound. Electronically Signed   By: Charline Bills M.D.   On: 10/29/2021 21:59        Scheduled Meds:  insulin aspart  0-6 Units Subcutaneous Q6H   Continuous Infusions:  sodium chloride Stopped (10/30/21 0111)     LOS: 0 days    Time spent: 35 minutes.     Alba Cory, MD [facility]   If 7PM-7AM, please contact night-coverage [email or web address]  10/30/2021, 7:50 AM

## 2021-10-30 NOTE — Assessment & Plan Note (Signed)
 #)   Type 2 Diabetes Mellitus: documented history of such.  Appears to be managed via lifestyle modifications .home insulin regimen: None. Home oral hypoglycemic agents: None. presenting blood sugar: 214.  Overall, glycemic control as an outpatient appears suboptimally controlled, with most recent hemoglobin A1c noted to be 12.3% on 01/03/2021.   Plan: In the setting of current n.p.o. status, I ordered Accu-Cheks every 6 hours with low-dose high scale insulin.  Check hemoglobin A1c.

## 2021-10-30 NOTE — Consult Note (Signed)
Referring Provider: [facility] Primary Care Physician:  Pcp, No Primary Gastroenterologist:  Althia Forts  Reason for Consultation:  Elevated LFTs, abdominal pain  HPI: Gilbert Graham is a 31 y.o. male medical history significant for chronic systolic heart failure, type 2 diabetes mellitus with most recent hemoglobin A1c noted to be 12.3% in August 2022 presents for evaluation of elevated LFTs.  Patient states since 5/29 he has been having R sided flank pain that wraps around to the front of his abdomen. Nothing makes this better or worse. Reports he has not eaten in the past few days as he has had trouble "keeping things down" due to nausea and vomiting. Vomit is nonbloody bilious emesis. States vomiting is worse when he coughs. Reports ongoing cough for 1 week with associated shortness of breath. Denies chest pain. Denies fever/chills. States he hasn't had a "good BM" since Sunday 5/28. Denies hematochezia or melena. Denies previous episodes of this. Denies family history of colon cancer or GI issues.  States he takes ASA daily for cardiac history, denies other NSAID use. Denies alcohol use. Reports tobacco use of 1 pack every 3-4 days. Denies ilicit drug use (although UDA was positive for THC, amphetamines, benzodiazepines, and opiates). Denies new medications. Denies sick contacts. Denies tylenol use.  No previous history of EGD/Colonoscopy  Past Medical History:  Diagnosis Date   CHF (congestive heart failure) (HCC)    Diabetes mellitus without complication ([place])    Scoliosis     Past Surgical History:  Procedure Laterality Date   TONSILLECTOMY     WISDOM TOOTH EXTRACTION      Prior to Admission medications   Not on File    Scheduled Meds:  insulin aspart  0-6 Units Subcutaneous Q6H   Continuous Infusions:  sodium chloride Stopped (10/30/21 0111)   potassium chloride 10 mEq (10/30/21 0756)   PRN Meds:.sodium chloride, acetaminophen **OR** acetaminophen, HYDROmorphone (DILAUDID)  injection, LORazepam, menthol-cetylpyridinium, naLOXone (NARCAN)  injection  Allergies as of 10/29/2021 - Review Complete 10/29/2021  Allergen Reaction Noted   Amoxicillin  12/28/2020   Imitrex [sumatriptan]  12/28/2020    Family History  Problem Relation Age of Onset   Asthma Mother    Heart failure Mother    Fibromyalgia Mother    Diabetes Father    Coronary artery disease Father     Social History   Socioeconomic History   Marital status: Married    Spouse name: Not on file   Number of children: Not on file   Years of education: Not on file   Highest education level: Not on file  Occupational History   Not on file  Tobacco Use   Smoking status: Every Day    Types: Cigars   Smokeless tobacco: Never  Vaping Use   Vaping Use: Never used  Substance and Sexual Activity   Alcohol use: Never   Drug use: Never   Sexual activity: Not on file  Other Topics Concern   Not on file  Social History Narrative   Not on file   Social Determinants of Health   Financial Resource Strain: Not on file  Food Insecurity: Not on file  Transportation Needs: Not on file  Physical Activity: Not on file  Stress: Not on file  Social Connections: Not on file  Intimate Partner Violence: Not on file    Review of Systems: Review of Systems  Constitutional:  Negative for chills, fever and weight loss.  HENT:  Negative for hearing loss and tinnitus.   Eyes:  Negative  for blurred vision and double vision.  Respiratory:  Positive for cough and shortness of breath.   Cardiovascular:  Negative for chest pain and palpitations.  Gastrointestinal:  Positive for abdominal pain. Negative for blood in stool, constipation, diarrhea, heartburn, melena, nausea and vomiting.  Genitourinary:  Negative for dysuria and urgency.  Musculoskeletal:  Negative for myalgias and neck pain.  Skin:  Negative for itching and rash.  Neurological:  Negative for seizures and loss of consciousness.   Psychiatric/Behavioral:  Positive for substance abuse. Negative for depression.     Physical Exam:Physical Exam Constitutional:      Appearance: He is well-developed. He is obese.  HENT:     Head: Normocephalic and atraumatic.     Nose: Nose normal. No congestion.     Mouth/Throat:     Mouth: Mucous membranes are moist.     Pharynx: Oropharynx is clear.  Eyes:     Extraocular Movements: Extraocular movements intact.     Conjunctiva/sclera: Conjunctivae normal.  Cardiovascular:     Rate and Rhythm: Normal rate and regular rhythm.  Pulmonary:     Effort: Pulmonary effort is normal. No respiratory distress.  Abdominal:     General: Bowel sounds are normal. There is no distension.     Palpations: Abdomen is soft. There is no mass.     Tenderness: There is abdominal tenderness (ruq). There is no guarding or rebound.     Hernia: No hernia is present.  Musculoskeletal:        General: No swelling. Normal range of motion.     Cervical back: Normal range of motion and neck supple.  Skin:    General: Skin is warm and dry.  Neurological:     General: No focal deficit present.     Mental Status: He is alert and oriented to person, place, and time.  Psychiatric:        Mood and Affect: Mood normal.        Behavior: Behavior normal.        Thought Content: Thought content normal.        Judgment: Judgment normal.    Vital signs: Vitals:   10/30/21 0216 10/30/21 0545  BP: 112/83 128/90  Pulse: 94 87  Resp: 16 (!) 22  Temp: 98.2 F (36.8 C) 98.1 F (36.7 C)  SpO2: 97% 93%   Last BM Date : 10/27/21    GI:  Lab Results: Recent Labs    10/29/21 1754 10/30/21 0559  WBC 13.2* 11.3*  HGB 13.7 12.6*  HCT 44.4 42.9  PLT 301 264   BMET Recent Labs    10/29/21 1754 10/30/21 0559  NA 135 140  K 3.4* 3.4*  CL 104 109  CO2 22 22  GLUCOSE 214* 114*  BUN 18 14  CREATININE 1.14 1.05  CALCIUM 8.2* 8.2*   LFT Recent Labs    10/30/21 0559  PROT 6.9  ALBUMIN 3.4*  AST  1,037*  ALT 1,350*  ALKPHOS 119  BILITOT 1.8*  BILIDIR 0.6*   PT/INR Recent Labs    10/29/21 2051 10/30/21 0559  LABPROT 18.4* 17.9*  INR 1.5* 1.5*     Studies/Results: CT Abdomen Pelvis W Contrast  Result Date: 10/29/2021 CLINICAL DATA:  Nausea/vomiting RUQ pain. 31 y/o male. Pt reports right upper abdominal pain radiating to right back since Monday. Pain increasing and has became constant. Reports projectile vomiting today x2 episode. EXAM: CT ABDOMEN AND PELVIS WITH CONTRAST TECHNIQUE: Multidetector CT imaging of the abdomen and pelvis  was performed using the standard protocol following bolus administration of intravenous contrast. RADIATION DOSE REDUCTION: This exam was performed according to the departmental dose-optimization program which includes automated exposure control, adjustment of the mA and/or kV according to patient size and/or use of iterative reconstruction technique. CONTRAST:  170m OMNIPAQUE IOHEXOL 300 MG/ML  SOLN COMPARISON:  None Available. FINDINGS: Lower chest: Prominent cardiac silhouette. Mild peribronchovascular ground-glass airspace opacity. No acute abnormality. Hepatobiliary: No focal liver abnormality. No gallstones, gallbladder wall thickening, or pericholecystic fluid. No biliary dilatation. Pancreas: No focal lesion. Normal pancreatic contour. No surrounding inflammatory changes. No main pancreatic ductal dilatation. Spleen: Normal in size without focal abnormality. Adrenals/Urinary Tract: No adrenal nodule bilaterally. Bilateral kidneys enhance symmetrically. No hydronephrosis. No hydroureter. The urinary bladder is unremarkable. Stomach/Bowel: Stomach is within normal limits. No evidence of bowel wall thickening or dilatation. Appendix appears normal. Vascular/Lymphatic: No abdominal aorta or iliac aneurysm. No abdominal, pelvic, or inguinal lymphadenopathy. Reproductive: Prostate is unremarkable. Other: No intraperitoneal free fluid. No intraperitoneal free  gas. No organized fluid collection. Musculoskeletal: Subcutaneus soft tissue edema.  No hernia. No suspicious lytic or blastic osseous lesions. No acute displaced fracture. Multilevel degenerative changes of the spine. IMPRESSION: 1. No acute intra-abdominal or intrapelvic abnormality. 2. Mild pulmonary edema in the setting of cardiomegaly. Electronically Signed   By: MIven FinnM.D.   On: 10/29/2021 22:27   DG Chest Port 1 View  Result Date: 10/29/2021 CLINICAL DATA:  Upper abdominal pain EXAM: PORTABLE CHEST 1 VIEW COMPARISON:  None Available. FINDINGS: Lungs are clear.  No pleural effusion or pneumothorax. Cardiomegaly. IMPRESSION: Cardiomegaly.  No evidence of acute cardiopulmonary disease. Electronically Signed   By: SJulian HyM.D.   On: 10/29/2021 21:17   [place]domen Limited RUQ (LIVER/GB)  Result Date: 10/29/2021 CLINICAL DATA:  Pain EXAM: ULTRASOUND ABDOMEN LIMITED RIGHT UPPER QUADRANT COMPARISON:  None Available. FINDINGS: Gallbladder: No gallstones or wall thickening visualized. No sonographic Murphy sign noted by sonographer. Common bile duct: Diameter: 4 mm Liver: Within the upper limits of normal for parenchymal echotexture. No focal hepatic lesion is seen. Portal vein is patent on color Doppler imaging with normal direction of blood flow towards the liver. Other: None. IMPRESSION: Negative right upper quadrant ultrasound. Electronically Signed   By: SJulian HyM.D.   On: 10/29/2021 21:59    Impression: Elevated LFTs, abdominal pain -  AST 1,037 (1,744)/ ALT 1,350 (1,682)/ Alk phos 119 (133) - T. Bili 1.8 - BUN 14, Cr. 1.05 - hgb 12.6 - leukocytosis with wbc 11.3 (13.2) - INR 1.5 - lipase 27 - UDA positive for opiates, benzo, amphetamines, and THC - Hepatitis panel pending - Ceruloplasmin normal - CT ab pelvis with contrast 5/31: no liver abnormality, no gallstones, no CBD dilation. Mild pulmonary edema in setting of cardiomegaly.  Hypokalemia  Prolonged  QTc  Plan: LFTs beginning to trend down. Hepatitis panel pending. Pending results of hepatitis panel, could consider MRCP to r/o choledocholithiasis (CT negative at this time). Continue to monitor LFTs Continue supportive care [facility] will follow    LOS: 0 days   Jaonna Word MRadford Pax PA-C 10/30/2021, 8:11 AM  Contact #  [phone]

## 2021-10-30 NOTE — Assessment & Plan Note (Signed)
#)  Acute transaminitis: In the setting of 2 days of right lower quadrant abdominal discomfort associated with nausea/vomiting, presenting labs suggestive of acute transaminitis, without associated overt cholestatic, given minimal elevation of alkaline phosphatase as well as total bilirubin, while CT abdomen/pelvis as well as right upper quadrant ultrasound demonstrate no evidence of acute intra-abdominal process, including no evidence of intrinsic/extrinsic biliary obstruction, as further detailed above.  Clinically, criteria for ascending cholangitis not currently met, particular in the absence of any associated fever or jaundice.  Overall, no overt evidence of underlying infectious process at this time.  Additionally, no evidence of hypotension or altered mental status at this time.  Consequently, will refrain from administration of additional IV antibiotics for now.  Etiology for patient's acute transaminitis not entirely clear at this time.  Given a nearly 10-month hiatus in liver enzyme results, differential nonalcoholic fatty liver disease, given patient's BMI greater than 50.  Denies any history of recurrent or recent alcohol consumption, although we will check serum ethanol level as well as urinary drug screen.  Given preceding shortness of breath/cough, will also check for any evidence of recent viral infection, including COVID-19 PCR as well as influenza PCR, which may have contributed to the patient's transaminitis.  Acute viral hepatitis panel results currently pending.  The setting of a reported history of chronic systolic heart failure, will also add on BNP to evaluate for any contribution from congestive hepatopathy, particularly given the patient's body habitus.  Of note, request for consultation from [facility] has been placed, with additional recommendations currently pending, as further detailed above.    Plan: GI consulted, as above.   Repeat CMP in the morning.  Check  GGT and direct bilirubin.  Repeat INR.  Follow-up result of acute viral hepatitis panel.  N.p.o. for now, pending additional GI recommendations.  Prn IV Dilaudid.  As needed IV Ativan.  Add on serum ethanol level, urinary drug screen.  Check LDH.  Check acetaminophen level as well as ferritin level.  Check ceruloplasmin level.  Check COVID-19 PCR/influenza PCR.  Repeat lipase.  Add on BNP.    

## 2021-10-30 NOTE — Assessment & Plan Note (Signed)
  #)   Morbid obesity: In the context of presenting BMI greater than 40, with most recent BMI specifically calculated to be 50.75, criteria met for morbid obesity.  Notable in the context of patient's acute transaminitis, of unclear etiology at this time.  Plan: Check TSH, hemoglobin A1c.  Repeat C MP in the morning.  Further evaluation management of presenting acute transaminitis, as above.

## 2021-10-31 DIAGNOSIS — F603 Borderline personality disorder: Secondary | ICD-10-CM | POA: Diagnosis not present

## 2021-10-31 DIAGNOSIS — R1011 Right upper quadrant pain: Secondary | ICD-10-CM

## 2021-10-31 DIAGNOSIS — K5900 Constipation, unspecified: Secondary | ICD-10-CM | POA: Diagnosis not present

## 2021-10-31 DIAGNOSIS — Z20822 Contact with and (suspected) exposure to covid-19: Secondary | ICD-10-CM | POA: Diagnosis not present

## 2021-10-31 DIAGNOSIS — Z833 Family history of diabetes mellitus: Secondary | ICD-10-CM | POA: Diagnosis not present

## 2021-10-31 DIAGNOSIS — Z886 Allergy status to analgesic agent status: Secondary | ICD-10-CM | POA: Diagnosis not present

## 2021-10-31 DIAGNOSIS — F17290 Nicotine dependence, other tobacco product, uncomplicated: Secondary | ICD-10-CM | POA: Diagnosis not present

## 2021-10-31 DIAGNOSIS — I272 Pulmonary hypertension, unspecified: Secondary | ICD-10-CM | POA: Diagnosis not present

## 2021-10-31 DIAGNOSIS — R7401 Elevation of levels of liver transaminase levels: Principal | ICD-10-CM

## 2021-10-31 DIAGNOSIS — I5022 Chronic systolic (congestive) heart failure: Secondary | ICD-10-CM | POA: Diagnosis not present

## 2021-10-31 DIAGNOSIS — I509 Heart failure, unspecified: Secondary | ICD-10-CM

## 2021-10-31 DIAGNOSIS — E876 Hypokalemia: Secondary | ICD-10-CM | POA: Diagnosis not present

## 2021-10-31 DIAGNOSIS — Z91148 Patient's other noncompliance with medication regimen for other reason: Secondary | ICD-10-CM | POA: Diagnosis not present

## 2021-10-31 DIAGNOSIS — Z8249 Family history of ischemic heart disease and other diseases of the circulatory system: Secondary | ICD-10-CM | POA: Diagnosis not present

## 2021-10-31 DIAGNOSIS — F3181 Bipolar II disorder: Secondary | ICD-10-CM | POA: Diagnosis present

## 2021-10-31 DIAGNOSIS — I429 Cardiomyopathy, unspecified: Secondary | ICD-10-CM | POA: Diagnosis not present

## 2021-10-31 DIAGNOSIS — Z6841 Body Mass Index (BMI) 40.0 and over, adult: Secondary | ICD-10-CM | POA: Diagnosis not present

## 2021-10-31 DIAGNOSIS — E119 Type 2 diabetes mellitus without complications: Secondary | ICD-10-CM | POA: Diagnosis not present

## 2021-10-31 DIAGNOSIS — Z881 Allergy status to other antibiotic agents status: Secondary | ICD-10-CM | POA: Diagnosis not present

## 2021-10-31 DIAGNOSIS — Z716 Tobacco abuse counseling: Secondary | ICD-10-CM | POA: Diagnosis not present

## 2021-10-31 DIAGNOSIS — R945 Abnormal results of liver function studies: Secondary | ICD-10-CM | POA: Diagnosis not present

## 2021-10-31 DIAGNOSIS — F4310 Post-traumatic stress disorder, unspecified: Secondary | ICD-10-CM | POA: Diagnosis not present

## 2021-10-31 DIAGNOSIS — R7989 Other specified abnormal findings of blood chemistry: Secondary | ICD-10-CM | POA: Diagnosis not present

## 2021-10-31 DIAGNOSIS — R69 Illness, unspecified: Secondary | ICD-10-CM | POA: Diagnosis not present

## 2021-10-31 DIAGNOSIS — R109 Unspecified abdominal pain: Secondary | ICD-10-CM | POA: Diagnosis not present

## 2021-10-31 LAB — CBC
HCT: 43.6 % (ref 39.0–52.0)
Hemoglobin: 12.8 g/dL — ABNORMAL LOW (ref 13.0–17.0)
MCH: 21.7 pg — ABNORMAL LOW (ref 26.0–34.0)
MCHC: 29.4 g/dL — ABNORMAL LOW (ref 30.0–36.0)
MCV: 73.8 fL — ABNORMAL LOW (ref 80.0–100.0)
Platelets: 287 10*3/uL (ref 150–400)
RBC: 5.91 MIL/uL — ABNORMAL HIGH (ref 4.22–5.81)
RDW: 18.6 % — ABNORMAL HIGH (ref 11.5–15.5)
WBC: 9.2 10*3/uL (ref 4.0–10.5)
nRBC: 0.8 % — ABNORMAL HIGH (ref 0.0–0.2)

## 2021-10-31 LAB — COMPREHENSIVE METABOLIC PANEL
ALT: 1139 U/L — ABNORMAL HIGH (ref 0–44)
AST: 562 U/L — ABNORMAL HIGH (ref 15–41)
Albumin: 3.5 g/dL (ref 3.5–5.0)
Alkaline Phosphatase: 117 U/L (ref 38–126)
Anion gap: 8 (ref 5–15)
BUN: 11 mg/dL (ref 6–20)
CO2: 23 mmol/L (ref 22–32)
Calcium: 8.7 mg/dL — ABNORMAL LOW (ref 8.9–10.3)
Chloride: 109 mmol/L (ref 98–111)
Creatinine, Ser: 0.83 mg/dL (ref 0.61–1.24)
GFR, Estimated: >60 mL/min (ref >60)
Glucose, Bld: 109 mg/dL — ABNORMAL HIGH (ref 70–99)
Potassium: 3.7 mmol/L (ref 3.5–5.1)
Sodium: 140 mmol/L (ref 135–145)
Total Bilirubin: 1.9 mg/dL — ABNORMAL HIGH (ref 0.3–1.2)
Total Protein: 7.2 g/dL (ref 6.5–8.1)

## 2021-10-31 LAB — HEMOGLOBIN A1C
Hgb A1c MFr Bld: 10.4 % — ABNORMAL HIGH (ref 4.8–5.6)
Mean Plasma Glucose: 252 mg/dL

## 2021-10-31 LAB — GLUCOSE, CAPILLARY
Glucose-Capillary: 105 mg/dL — ABNORMAL HIGH (ref 70–99)
Glucose-Capillary: 188 mg/dL — ABNORMAL HIGH (ref 70–99)
Glucose-Capillary: 150 mg/dL — ABNORMAL HIGH (ref 70–99)

## 2021-10-31 LAB — CERULOPLASMIN: Ceruloplasmin: 45.8 mg/dL — ABNORMAL HIGH (ref 16.0–31.0)

## 2021-10-31 MED ORDER — CARVEDILOL 3.125 MG PO TABS: 3.1250 mg | ORAL_TABLET | Freq: Two times a day (BID) | ORAL | Status: DC

## 2021-10-31 MED ORDER — LITHIUM CARBONATE ER 450 MG PO TBCR
450.0000 mg | EXTENDED_RELEASE_TABLET | Freq: Every day | ORAL | Status: DC
  Administered 2021-10-31: 450 mg via ORAL
  Filled 2021-10-31: qty 1

## 2021-10-31 MED ORDER — CLONAZEPAM 0.5 MG PO TABS
0.5000 mg | ORAL_TABLET | Freq: Every day | ORAL | Status: DC | PRN
  Administered 2021-10-31: 0.5 mg via ORAL
  Filled 2021-10-31: qty 1

## 2021-10-31 MED ORDER — TORSEMIDE 20 MG PO TABS
40.0000 mg | ORAL_TABLET | Freq: Every day | ORAL | Status: DC
  Filled 2021-10-31: qty 2

## 2021-10-31 MED ORDER — ATORVASTATIN CALCIUM 10 MG PO TABS: 20.0000 mg | ORAL_TABLET | Freq: Every day | ORAL | Status: DC

## 2021-10-31 MED ORDER — TORSEMIDE 20 MG PO TABS
20.0000 mg | ORAL_TABLET | Freq: Every day | ORAL | Status: DC
  Administered 2021-10-31 – 2021-11-01 (×2): 20 mg via ORAL
  Filled 2021-10-31 (×2): qty 1

## 2021-10-31 MED ORDER — LOSARTAN POTASSIUM 50 MG PO TABS
25.0000 mg | ORAL_TABLET | Freq: Every day | ORAL | Status: DC
  Administered 2021-10-31 – 2021-11-01 (×2): 25 mg via ORAL
  Filled 2021-10-31 (×2): qty 1

## 2021-10-31 MED ORDER — ONDANSETRON 4 MG PO TBDP
4.0000 mg | ORAL_TABLET | Freq: Three times a day (TID) | ORAL | Status: DC | PRN
  Administered 2021-10-31 – 2021-11-01 (×2): 4 mg via ORAL
  Filled 2021-10-31 (×2): qty 1

## 2021-10-31 MED ORDER — PREGABALIN 50 MG PO CAPS
100.0000 mg | ORAL_CAPSULE | Freq: Two times a day (BID) | ORAL | Status: DC
  Administered 2021-10-31 – 2021-11-01 (×3): 100 mg via ORAL
  Filled 2021-10-31 (×3): qty 2

## 2021-10-31 MED ORDER — HYDROMORPHONE HCL 1 MG/ML IJ SOLN
0.5000 mg | Freq: Four times a day (QID) | INTRAMUSCULAR | Status: DC | PRN
  Administered 2021-10-31 – 2021-11-01 (×3): 0.5 mg via INTRAVENOUS
  Filled 2021-10-31 (×3): qty 0.5

## 2021-10-31 MED ORDER — FLUOXETINE HCL 20 MG PO CAPS
40.0000 mg | ORAL_CAPSULE | Freq: Every day | ORAL | Status: DC
  Administered 2021-10-31 – 2021-11-01 (×2): 40 mg via ORAL
  Filled 2021-10-31 (×2): qty 2

## 2021-10-31 MED ORDER — POLYETHYLENE GLYCOL 3350 17 G PO PACK
17.0000 g | PACK | Freq: Three times a day (TID) | ORAL | Status: DC
  Administered 2021-10-31 – 2021-11-01 (×4): 17 g via ORAL
  Filled 2021-10-31 (×4): qty 1

## 2021-10-31 NOTE — Progress Notes (Addendum)
[facility] Progress Note  Gilbert Graham 31 y.o. 09/08/1990  CC:  Elevated LFTs   Subjective: Patient seen and examined at bedside. Eating french toast and tolerating diet without difficulty. Patient states he is continuing to have coughing. Has RUQ pain that is worse with coughing. Has not had a BM yet.  ROS : Review of Systems  Constitutional:  Negative for chills and fever.  Respiratory:  Positive for cough. Negative for hemoptysis.   Gastrointestinal:  Positive for abdominal pain and constipation. Negative for blood in stool, diarrhea, heartburn, melena, nausea and vomiting.     Objective: Vital signs in last 24 hours: Vitals:   10/30/21 1941 10/31/21 0321  BP: 113/86 113/77  Pulse: 88 92  Resp: 16 18  Temp: 98.1 F (36.7 C) 98.5 F (36.9 C)  SpO2: 100% 96%    Physical Exam:  General:  Alert, obese, cooperative, no distress, appears stated age  Head:  Normocephalic, without obvious abnormality, atraumatic  Eyes:  Anicteric sclera, EOM's intact  Lungs:   Clear to auscultation bilaterally, respirations unlabored  Heart:  Regular rate and rhythm, S1, S2 normal  Abdomen:   Soft, non-tender, bowel sounds active all four quadrants,  no masses,     Lab Results: Recent Labs    10/30/21 0559 10/31/21 0512  NA 140 140  K 3.4* 3.7  CL 109 109  CO2 22 23  GLUCOSE 114* 109*  BUN 14 11  CREATININE 1.05 0.83  CALCIUM 8.2* 8.7*  MG 2.0  --    Recent Labs    10/30/21 0559 10/31/21 0512  AST 1,037* 562*  ALT 1,350* 1,139*  ALKPHOS 119 117  BILITOT 1.8* 1.9*  PROT 6.9 7.2  ALBUMIN 3.4* 3.5   Recent Labs    10/30/21 0559 10/31/21 0512  WBC 11.3* 9.2  NEUTROABS 7.3  --   HGB 12.6* 12.8*  HCT 42.9 43.6  MCV 72.5* 73.8*  PLT 264 287   Recent Labs    10/29/21 2051 10/30/21 0559  LABPROT 18.4* 17.9*  INR 1.5* 1.5*      Assessment Elevated LFTs, abdominal pain -  AST 562 (1,037)/ ALT 1,139 (1,350)/ Alk phos 117 - T. Bili 1.9 - BUN 14, Cr.  1.05 - hgb 12.6 - resolution of leukocytosis with WBC 9.2 - INR 1.5 - lipase 27 - UDA positive for opiates, benzo, amphetamines, and THC - Hepatitis panel negative - Ceruloplasmin normal - CT ab pelvis with contrast 5/31: no liver abnormality, no gallstones, no CBD dilation. Mild pulmonary edema in setting of cardiomegaly.   Hypokalemia   Prolonged QTc   Plan: LFTs continue to trend down. Negative imaging and hepatitis panel. Patient possibly experienced shock liver which is beginning to resolve. Will continue to monitor LFTs. RUQ pain is worse with coughing and movement, suggest musculoskeletal in nature. Continue Miralax twice daily for constipation GI will follow   Bayley M McMichael PA-C 10/31/2021, 8:41 AM  Contact #  [phone]  

## 2021-10-31 NOTE — Progress Notes (Signed)
PROGRESS NOTE    Gilbert Graham  [id number] DOB: Jul 11, 1990 DOA: 10/29/2021 PCP: Gilbert Graham, No   Brief Narrative: 31 year old with past medical history significant for chronic systolic heart failure, diabetes type 2, most recent A1c 12.01 January 2021 admitted to [facility] on 5/31 complaining of abdominal pain and acute transaminitis. He reported shortness of breath a few days prior to admission that has resolved.    Assessment & Plan:   Principal Problem:   Elevated LFTs Active Problems:   Hypokalemia   Obesity, Class III, BMI 40-49.9 (morbid obesity) (HCC)   Prolonged QT interval   Chronic systolic CHF (congestive heart failure) (HCC)   DM2 (diabetes mellitus, type 2) (HCC)   1-Acute transaminitis:  Shock liver ? Congested Hepatopathy.  Hepatitis panel non reactive.  TSH normal. CK level normal.  Abdominal US; Negative right upper quadrant [place]  GI consulted. Recommend support care.  Tylenol negative.  LFT trending down.   Chronic Systolic  Heart failure:  ECHO: 2018: EF 55 % He report he was diagnosed with systolic HFF in [place] [id number].  ECHO. Ef 20 % Resume coreg.  Appreciate cardiology evaluation.  Plan to start torsemide and Cozaar.   Hypokalemia: Replaced.   Morbid Obesity: Needs life style modifications.   QT prolong: replete potassium.  EKG improved.   Diabetes Type 2:  A1c 10.4 SSI.   Right lower Quadrant abdominal pain;  No BM in 1 week.  KUB negative for obstruction.  Started  Bowel regimen.  Had small BM  Bipolar, PTSD, Borderline personality disorder:  Resume lithium. Klonopin. Prozac.    Estimated body mass index is 51.52 kg/m as calculated from the following:   Height as of this encounter: 5\' 6"  (1.676 m).   Weight as of this encounter: 144.8 kg.   DVT prophylaxis: SCD Code Status: Full code Family Communication: care discussed with patient.  Disposition Plan:  Status is: Observation The patient remains OBS appropriate and will d/c  before 2 midnights.    Consultants:  GI  Procedures:  Abdominal US  Antimicrobials:    Subjective: He is still complaining of abdominal pain.  Had small BM  Objective: Vitals:   10/30/21 1831 10/30/21 1941 10/31/21 0321 10/31/21 0458  BP: 131/68 113/86 113/77   Pulse: 91 88 92   Resp:  16 18   Temp: 98.7 F (37.1 C) 98.1 F (36.7 C) 98.5 F (36.9 C)   TempSrc: Oral Oral Oral   SpO2: 97% 100% 96%   Weight:    (!) 144.8 kg  Height:        Intake/Output Summary (Last 24 hours) at 10/31/2021 [id number] Last data filed at 10/30/2021 1505 Gross per 24 hour  Intake 360 ml  Output --  Net 360 ml    Filed Weights   10/29/21 2154 10/30/21 0500 10/31/21 0458  Weight: (!) 142.6 kg (!) 143.7 kg (!) 144.8 kg    Examination:  General exam: NAD Respiratory system: CTA Cardiovascular system: S 1, S 2 RRR Gastrointestinal system: BS present, soft, nt Central nervous system: alert Extremities: no edema    Data Reviewed: I have personally reviewed following labs and imaging studies  CBC: Recent Labs  Lab 10/29/21 1754 10/30/21 0559 10/31/21 0512  WBC 13.2* 11.3* 9.2  NEUTROABS  --  7.3  --   HGB 13.7 12.6* 12.8*  HCT 44.4 42.9 43.6  MCV 69.6* 72.5* 73.8*  PLT 301 264 [id number]    Basic Metabolic Panel: Recent Labs  Lab 10/29/21 1754 10/30/21 0559  10/31/21 0512  NA 135 140 140  K 3.4* 3.4* 3.7  CL 104 109 109  CO2 22 22 23   GLUCOSE 214* 114* 109*  BUN 18 14 11   CREATININE 1.14 1.05 0.83  CALCIUM 8.2* 8.2* 8.7*  MG  --  2.0  --     GFR: Estimated Creatinine Clearance: 175.5 mL/min (by C-G formula based on SCr of 0.83 mg/dL). Liver Function Tests: Recent Labs  Lab 10/29/21 1754 10/30/21 0559 10/31/21 0512  AST 1,744* 1,037* 562*  ALT 1,682* 1,350* 1,139*  ALKPHOS 133* 119 117  BILITOT 1.5* 1.8* 1.9*  PROT 7.0 6.9 7.2  ALBUMIN 3.5 3.4* 3.5    Recent Labs  Lab 10/29/21 1754 10/30/21 0559  LIPASE 55* 27    No results for input(s): AMMONIA in the  last 168 hours. Coagulation Profile: Recent Labs  Lab 10/29/21 2051 10/30/21 0559  INR 1.5* 1.5*    Cardiac Enzymes: Recent Labs  Lab 10/30/21 0559  CKTOTAL 146   BNP (last 3 results) No results for input(s): PROBNP in the last 8760 hours. HbA1C: Recent Labs    10/30/21 0559  HGBA1C 10.4*   CBG: Recent Labs  Lab 10/30/21 0547 10/30/21 1151 10/30/21 1752 10/30/21 2357 10/31/21 0520  GLUCAP 111* 119* 159* 112* 105*    Lipid Profile: No results for input(s): CHOL, HDL, LDLCALC, TRIG, CHOLHDL, LDLDIRECT in the last 72 hours. Thyroid Function Tests: Recent Labs    10/30/21 0559  TSH 2.480   Anemia Panel: Recent Labs    10/30/21 0559  FERRITIN 52    Sepsis Labs: Recent Labs  Lab 10/29/21 2051 10/29/21 2349  LATICACIDVEN 1.5 1.1     Recent Results (from the past 240 hour(s))  Blood Culture (routine x 2)     Status: None (Preliminary result)   Collection Time: 10/29/21  8:50 PM   Specimen: BLOOD  Result Value Ref Range Status   Specimen Description   Final    BLOOD RIGHT ANTECUBITAL Performed at [facility], [address] [place], [place] [address]    Special Requests   Final    BOTTLES DRAWN AEROBIC AND ANAEROBIC Blood Culture adequate volume Performed at [facility], [address] [place], [place] [address]    Culture   Final    NO GROWTH < 24 HOURS Performed at [facility], [place] [address] [place], [place] [address]    Report Status PENDING  Incomplete  Blood Culture (routine x 2)     Status: None (Preliminary result)   Collection Time: 10/29/21  9:10 PM   Specimen: BLOOD  Result Value Ref Range Status   Specimen Description   Final    BLOOD LEFT ANTECUBITAL Performed at [facility], [address] [place], [place] [address]    Special Requests   Final    BOTTLES DRAWN AEROBIC AND ANAEROBIC Blood Culture adequate volume Performed at [facility], [address] [place], [place] [address]    Culture   Final    NO GROWTH < 24 HOURS Performed at [facility], [place] [address], [place], [place] [address]    Report Status PENDING  Incomplete  SARS Coronavirus 2 by RT PCR (hospital order, performed in [facility] hospital lab) *cepheid single result test* Anterior Nasal Swab     Status: None   Collection Time: 10/30/21  4:38 AM   Specimen: Anterior Nasal Swab  Result Value Ref  Range Status   SARS Coronavirus 2 by RT PCR NEGATIVE NEGATIVE Final    Comment: (NOTE) SARS-CoV-2 target nucleic acids are NOT DETECTED.  The SARS-CoV-2 RNA is generally detectable in upper and lower respiratory specimens during the acute phase of infection. The lowest concentration of SARS-CoV-2 viral copies this assay can detect is 250 copies / mL. A negative result does not preclude SARS-CoV-2 infection and should not be used as the sole basis for treatment or other patient management decisions.  A negative result may occur with improper specimen collection / handling, submission of specimen other than nasopharyngeal swab, presence of viral mutation(s) within the areas targeted by this assay, and inadequate number of viral copies (<250 copies / mL). A negative result must be combined with clinical observations, patient history, and epidemiological information.  Fact Sheet for Patients:   https://www.patel.info/  Fact Sheet for Healthcare Providers: https://hall.com/  This test is not yet approved or  cleared by the [place] FDA and has been authorized for detection and/or diagnosis of SARS-CoV-2 by FDA under an Emergency Use Authorization (EUA).  This EUA will remain in effect (meaning this test can be used) for the duration of the COVID-19 declaration under Section 564(b)(1) of the Act, 21 U.S.C. section 360bbb-3(b)(1), unless the authorization is terminated or revoked sooner.  Performed at [facility],  [place] [address], [place], [place] [address]   Respiratory (~20 pathogens) panel by PCR     Status: None   Collection Time: 10/30/21  4:38 AM   Specimen: Nasopharyngeal Swab; Respiratory  Result Value Ref Range Status   Adenovirus NOT DETECTED NOT DETECTED Final   Coronavirus 229E NOT DETECTED NOT DETECTED Final    Comment: (NOTE) The Coronavirus on the Respiratory Panel, DOES NOT test for the novel  Coronavirus (2019 nCoV)    Coronavirus HKU1 NOT DETECTED NOT DETECTED Final   Coronavirus NL63 NOT DETECTED NOT DETECTED Final   Coronavirus OC43 NOT DETECTED NOT DETECTED Final   Metapneumovirus NOT DETECTED NOT DETECTED Final   Rhinovirus / Enterovirus NOT DETECTED NOT DETECTED Final   Influenza A NOT DETECTED NOT DETECTED Final   Influenza B NOT DETECTED NOT DETECTED Final   Parainfluenza Virus 1 NOT DETECTED NOT DETECTED Final   Parainfluenza Virus 2 NOT DETECTED NOT DETECTED Final   Parainfluenza Virus 3 NOT DETECTED NOT DETECTED Final   Parainfluenza Virus 4 NOT DETECTED NOT DETECTED Final   Respiratory Syncytial Virus NOT DETECTED NOT DETECTED Final   Bordetella pertussis NOT DETECTED NOT DETECTED Final   Bordetella Parapertussis NOT DETECTED NOT DETECTED Final   Chlamydophila pneumoniae NOT DETECTED NOT DETECTED Final   Mycoplasma pneumoniae NOT DETECTED NOT DETECTED Final    Comment: Performed at [facility], [place]. [address] [place], [place] [address]          Radiology Studies: DG Abd 1 View  Result Date: 10/30/2021 CLINICAL DATA:  Abdominal pain elevated LFTs. EXAM: ABDOMEN - 1 VIEW COMPARISON:  CT Oct 29, 2021 FINDINGS: The bowel gas pattern is normal. Radiopaque excreted contrast material in the bladder. IMPRESSION: Normal bowel gas pattern. Electronically Signed   By: Dahlia Bailiff M.D.   On: 10/30/2021 10:13   CT Abdomen Pelvis W Contrast  Result Date: 10/29/2021 CLINICAL DATA:  Nausea/vomiting RUQ pain. 31 y/o male. Pt reports right upper abdominal pain  radiating to right back since Monday. Pain increasing and has became constant. Reports projectile vomiting today x2 episode. EXAM: CT ABDOMEN AND PELVIS WITH CONTRAST  TECHNIQUE: Multidetector CT imaging of the abdomen and pelvis was performed using the standard protocol following bolus administration of intravenous contrast. RADIATION DOSE REDUCTION: This exam was performed according to the departmental dose-optimization program which includes automated exposure control, adjustment of the mA and/or kV according to patient size and/or use of iterative reconstruction technique. CONTRAST:  117mL OMNIPAQUE IOHEXOL 300 MG/ML  SOLN COMPARISON:  None Available. FINDINGS: Lower chest: Prominent cardiac silhouette. Mild peribronchovascular ground-glass airspace opacity. No acute abnormality. Hepatobiliary: No focal liver abnormality. No gallstones, gallbladder wall thickening, or pericholecystic fluid. No biliary dilatation. Pancreas: No focal lesion. Normal pancreatic contour. No surrounding inflammatory changes. No main pancreatic ductal dilatation. Spleen: Normal in size without focal abnormality. Adrenals/Urinary Tract: No adrenal nodule bilaterally. Bilateral kidneys enhance symmetrically. No hydronephrosis. No hydroureter. The urinary bladder is unremarkable. Stomach/Bowel: Stomach is within normal limits. No evidence of bowel wall thickening or dilatation. Appendix appears normal. Vascular/Lymphatic: No abdominal aorta or iliac aneurysm. No abdominal, pelvic, or inguinal lymphadenopathy. Reproductive: Prostate is unremarkable. Other: No intraperitoneal free fluid. No intraperitoneal free gas. No organized fluid collection. Musculoskeletal: Subcutaneus soft tissue edema.  No hernia. No suspicious lytic or blastic osseous lesions. No acute displaced fracture. Multilevel degenerative changes of the spine. IMPRESSION: 1. No acute intra-abdominal or intrapelvic abnormality. 2. Mild pulmonary edema in the setting of  cardiomegaly. Electronically Signed   By: Iven Finn M.D.   On: 10/29/2021 22:27   DG Chest Port 1 View  Result Date: 10/29/2021 CLINICAL DATA:  Upper abdominal pain EXAM: PORTABLE CHEST 1 VIEW COMPARISON:  None Available. FINDINGS: Lungs are clear.  No pleural effusion or pneumothorax. Cardiomegaly. IMPRESSION: Cardiomegaly.  No evidence of acute cardiopulmonary disease. Electronically Signed   By: Julian Hy M.D.   On: 10/29/2021 21:17   ECHOCARDIOGRAM COMPLETE  Result Date: 10/30/2021    ECHOCARDIOGRAM REPORT   Patient Name:   Gilbert Graham Date of Exam: 10/30/2021 Medical Rec #:  [id number]      Height:       66.0 in Accession #:    [id number]     Weight:       316.8 lb Date of Birth:  [birth year]      BSA:          2.432 m Patient Age:    31 years       BP:           123/91 mmHg Patient Gender: M              HR:           88 bpm. Exam Location:  Inpatient Procedure: 2D Echo, Cardiac Doppler and Color Doppler Indications:    CHF  History:        Patient has no prior history of Echocardiogram examinations.                 Risk Factors:Diabetes.  Sonographer:    Jefferey Pica Referring Phys: [phone] Kelsa Jaworowski A Avarey Yaeger IMPRESSIONS  1. Left ventricular ejection fraction, by estimation, is 20 to 25%. The left ventricle has severely decreased function. The left ventricle demonstrates global hypokinesis. The left ventricular internal cavity size was severely dilated. There is mild concentric left ventricular hypertrophy. Left ventricular diastolic parameters are indeterminate.  2. Right ventricular systolic function is normal. The right ventricular size is normal. There is moderately elevated pulmonary artery systolic pressure.  3. The mitral valve is normal in structure. No evidence of mitral valve regurgitation. No evidence of mitral stenosis.  4. The aortic valve is normal in structure. Aortic valve regurgitation is not visualized. No aortic stenosis is present.  5. The inferior vena cava is normal  in size with greater than 50% respiratory variability, suggesting right atrial pressure of 3 mmHg. FINDINGS  Left Ventricle: Left ventricular ejection fraction, by estimation, is 20 to 25%. The left ventricle has severely decreased function. The left ventricle demonstrates global hypokinesis. The left ventricular internal cavity size was severely dilated. There is mild concentric left ventricular hypertrophy. Left ventricular diastolic parameters are indeterminate. Right Ventricle: The right ventricular size is normal. No increase in right ventricular wall thickness. Right ventricular systolic function is normal. There is moderately elevated pulmonary artery systolic pressure. The tricuspid regurgitant velocity is 2.77 m/s, and with an assumed right atrial pressure of 15 mmHg, the estimated right ventricular systolic pressure is [id number] mmHg. Left Atrium: Left atrial size was normal in size. Right Atrium: Right atrial size was normal in size. Pericardium: There is no evidence of pericardial effusion. Mitral Valve: The mitral valve is normal in structure. No evidence of mitral valve regurgitation. No evidence of mitral valve stenosis. Tricuspid Valve: The tricuspid valve is normal in structure. Tricuspid valve regurgitation is not demonstrated. No evidence of tricuspid stenosis. Aortic Valve: The aortic valve is normal in structure. Aortic valve regurgitation is not visualized. No aortic stenosis is present. Aortic valve peak gradient measures 3.3 mmHg. Pulmonic Valve: The pulmonic valve was normal in structure. Pulmonic valve regurgitation is not visualized. No evidence of pulmonic stenosis. Aorta: The aortic root is normal in size and structure. Venous: The inferior vena cava is normal in size with greater than 50% respiratory variability, suggesting right atrial pressure of 3 mmHg. IAS/Shunts: No atrial level shunt detected by color flow Doppler.  LEFT VENTRICLE PLAX 2D LVIDd:         7.00 cm LVIDs:         6.30 cm  LV PW:         1.40 cm LV IVS:        1.10 cm LVOT diam:     2.00 cm LV SV:         49 LV SV Index:   20 LVOT Area:     3.14 cm  LV Volumes (MOD) LV vol d, MOD A4C: 257.0 ml LV vol s, MOD A4C: 200.0 ml LV SV MOD A4C:     257.0 ml RIGHT VENTRICLE             IVC RV Basal diam:  3.90 cm     IVC diam: 2.80 cm RV S prime:     10.20 cm/s TAPSE (M-mode): 1.7 cm LEFT ATRIUM             Index        RIGHT ATRIUM           Index LA diam:        4.70 cm 1.93 cm/m   RA Area:     22.80 cm LA Vol (A2C):   73.8 ml 30.35 ml/m  RA Volume:   74.50 ml  30.64 ml/m LA Vol (A4C):   72.2 ml 29.69 ml/m LA Biplane Vol: 79.7 ml 32.78 ml/m  AORTIC VALVE                 PULMONIC VALVE AV Area (Vmax): 3.04 cm     PV Vmax:       0.70 m/s AV Vmax:        90.50  cm/s   PV Peak grad:  2.0 mmHg AV Peak Grad:   3.3 mmHg LVOT Vmax:      87.50 cm/s LVOT Vmean:     54.700 cm/s LVOT VTI:       0.155 m  AORTA Ao Root diam: 3.20 cm Ao Asc diam:  3.00 cm MITRAL VALVE               TRICUSPID VALVE MV Area (PHT): 6.54 cm    TR Peak grad:   30.7 mmHg MV Decel Time: 116 msec    TR Vmax:        277.00 cm/s MV E velocity: 81.40 cm/s MV A velocity: 32.60 cm/s  SHUNTS MV E/A ratio:  2.50        Systemic VTI:  0.16 m                            Systemic Diam: 2.00 cm Kardie Tobb DO Electronically signed by Berniece Salines DO Signature Date/Time: 10/30/2021/5:00:23 PM    Final    US Abdomen Limited RUQ (LIVER/GB)  Result Date: 10/29/2021 CLINICAL DATA:  Pain EXAM: ULTRASOUND ABDOMEN LIMITED RIGHT UPPER QUADRANT COMPARISON:  None Available. FINDINGS: Gallbladder: No gallstones or wall thickening visualized. No sonographic Murphy sign noted by sonographer. Common bile duct: Diameter: 4 mm Liver: Within the upper limits of normal for parenchymal echotexture. No focal hepatic lesion is seen. Portal vein is patent on color Doppler imaging with normal direction of blood flow towards the liver. Other: None. IMPRESSION: Negative right upper quadrant ultrasound.  Electronically Signed   By: Julian Hy M.D.   On: 10/29/2021 21:59        Scheduled Meds:  atorvastatin  20 mg Oral QHS   carvedilol  3.125 mg Oral BID   FLUoxetine  40 mg Oral Daily   insulin aspart  0-6 Units Subcutaneous Q6H   pantoprazole (PROTONIX) IV  40 mg Intravenous Q12H   polyethylene glycol  17 g Oral BID   pregabalin  100 mg Oral BID   senna-docusate  1 tablet Oral BID   Continuous Infusions:  sodium chloride Stopped (10/30/21 0111)     LOS: 0 days    Time spent: 35 minutes.     Elmarie Shiley, MD [facility]   If 7PM-7AM, please contact night-coverage [email or web address]  10/31/2021, 8:07 AM

## 2021-10-31 NOTE — Consult Note (Signed)
Cardiology Consultation:   Patient ID: Gilbert Graham MRN: [id number]; DOB: [birth year]  Admit date: 10/29/2021 Date of Consult: 10/31/2021  PCP:  Oneita Hurt, No   [facility] Providers Cardiologist:  None      Patient Profile:   Gilbert Graham is a 31 y.o. male with a hx of chronic systolic heart failure, type 2 DM, obesity, bipolar 2 disorder, PTSD, borderline personality disorder who is being seen 10/31/2021 for the evaluation of CHF at the request of Dr. Sunnie Nielsen.  History of Present Illness:   [name] is a 31 year old male with above medical history. Per chart review, patient  had an echocardiogram on 04/14/2017 through [facility] health that showed EF 55%. Later had an echocardiogram on 02/11/2021 through [facility] health that showed EF 20%, moderately dilated LV. Was seen by [facility] cardiology on 02/08/21 for follow up, patient declined LifeVest at that time. Was instructed to continue his prescribed coreg, losartan, spironolactone, and torsemide. Cardiac catheterization was arranged, however was canceled by the patient. There were several attempts made to reschedule the cath, however patient and spouse were not able to be reached.   Patient presented to the ED on 10/29/21 complaining of 2 days of sharp abdominal discomfort that was located in the right lower abdominal quadrant with some radiation to the right upper quadrant. Also reported 1 week of progressive SOB, new onset nonproductive cough. Workup in the ED revealed Na 135, K 3.4, creatinine 1.14, alkaline phosphatase 133, AST 1744, ALT 1682, WBC 13.2, hemoglobin 13.7, platelets 301. BNP elevated to 559.6.  EKG showed sinus tachycardia with HR 108, biatrial enlargement, prolonged QT (Qtc 523 ms).  CXR showed cardiomegaly, no evidence of acute cardiopulmonary disease.   Patient was admitted to the hospitalist service for treatment/evaluation of acute transaminitis.  Echocardiogram 10/30/21 showed EF 20-25%, severely dilated left ventricle, normal RV  systolic function.   On interview, patient denies having any chest pain, palpitations, SOB, orthopnea. Abdominal pain is improving. Reports that he was told he had heart failure in September 2022, but did not have cath completed because he did not know it was scheduled. Endorsed tobacco use   Past Medical History:  Diagnosis Date   CHF (congestive heart failure) (HCC)    Diabetes mellitus without complication (HCC)    Scoliosis     Past Surgical History:  Procedure Laterality Date   TONSILLECTOMY     WISDOM TOOTH EXTRACTION       Home Medications:  Prior to Admission medications   Medication Sig Start Date End Date Taking? Authorizing Provider  albuterol (VENTOLIN HFA) 108 (90 Base) MCG/ACT inhaler Inhale 2 puffs into the lungs every 6 (six) hours as needed for wheezing or shortness of breath. 12/31/19  Yes [provider]  atorvastatin (LIPITOR) 20 MG tablet Take 20 mg by mouth at bedtime. 09/18/21  Yes [provider]  carvedilol (COREG) 3.125 MG tablet Take 3.125 mg by mouth 2 (two) times daily. 10/21/21  Yes [provider]  clonazePAM (KLONOPIN) 0.5 MG tablet Take 0.5 mg by mouth daily as needed for anxiety. 10/13/21  Yes [provider]  EQ ASPIRIN ADULT LOW DOSE 81 MG tablet Take 81 mg by mouth daily. 08/20/21  Yes [provider]  FARXIGA 5 MG TABS tablet Take 5 mg by mouth daily. 09/25/21  Yes [provider]  FLUoxetine (PROZAC) 40 MG capsule Take 40 mg by mouth daily. 10/13/21  Yes [provider]  lithium carbonate (ESKALITH) 450 MG CR tablet Take 450 mg  by mouth at bedtime. 10/13/21  Yes [provider]  prazosin (MINIPRESS) 5 MG capsule Take 5 mg by mouth at bedtime as needed (sleep). 09/04/21  Yes [provider]  pregabalin (LYRICA) 100 MG capsule Take 100 mg by mouth 2 (two) times daily. 09/18/21  Yes [provider]  traZODone (DESYREL) 50 MG tablet Take 50 mg by mouth at bedtime as needed  for sleep. 09/04/21  Yes [provider]  metFORMIN (GLUCOPHAGE) 500 MG tablet Take 1,000 mg by mouth 2 (two) times daily. Patient not taking: Reported on 10/30/2021 10/01/20   [provider]  tiZANidine (ZANAFLEX) 4 MG tablet Take 4 mg by mouth every 12 (twelve) hours as needed for muscle spasms. Patient not taking: Reported on 10/30/2021 07/20/21   [provider]    Inpatient Medications: Scheduled Meds:  atorvastatin  20 mg Oral QHS   carvedilol  3.125 mg Oral BID   FLUoxetine  40 mg Oral Daily   insulin aspart  0-6 Units Subcutaneous Q6H   pantoprazole (PROTONIX) IV  40 mg Intravenous Q12H   polyethylene glycol  17 g Oral BID   pregabalin  100 mg Oral BID   senna-docusate  1 tablet Oral BID   Continuous Infusions:  sodium chloride Stopped (10/30/21 0111)   PRN Meds: sodium chloride, acetaminophen **OR** acetaminophen, clonazePAM, HYDROmorphone (DILAUDID) injection, menthol-cetylpyridinium, naLOXone (NARCAN)  injection  Allergies:    Allergies  Allergen Reactions   Amoxicillin Diarrhea   Imitrex [Sumatriptan] Other (See Comments)    Makes migraine worse    Social History:   Social History   Socioeconomic History   Marital status: Married    Spouse name: Not on file   Number of children: Not on file   Years of education: Not on file   Highest education level: Not on file  Occupational History   Not on file  Tobacco Use   Smoking status: Every Day    Types: Cigars   Smokeless tobacco: Never  Vaping Use   Vaping Use: Never used  Substance and Sexual Activity   Alcohol use: Never   Drug use: Never   Sexual activity: Not on file  Other Topics Concern   Not on file  Social History Narrative   Not on file   Social Determinants of Health   Financial Resource Strain: Not on file  Food Insecurity: Not on file  Transportation Needs: Not on file  Physical Activity: Not on file  Stress: Not on file  Social Connections: Not on file  Intimate  Partner Violence: Not on file    Family History:    Family History  Problem Relation Age of Onset   Asthma Mother    Heart failure Mother    Fibromyalgia Mother    Diabetes Father    Coronary artery disease Father      ROS:  Please see the history of present illness.  All other ROS reviewed and negative.     Physical Exam/Data:   Vitals:   10/30/21 1831 10/30/21 1941 10/31/21 0321 10/31/21 0458  BP: 131/68 113/86 113/77   Pulse: 91 88 92   Resp:  16 18   Temp: 98.7 F (37.1 C) 98.1 F (36.7 C) 98.5 F (36.9 C)   TempSrc: Oral Oral Oral   SpO2: 97% 100% 96%   Weight:    (!) 144.8 kg  Height:        Intake/Output Summary (Last 24 hours) at 10/31/2021 6712 Last data filed at 10/31/2021 [phone]  Gross per 24 hour  Intake 600 ml  Output --  Net 600 ml      10/31/2021    4:58 AM 10/30/2021    5:00 AM 10/29/2021    9:54 PM  Last 3 Weights  Weight (lbs) 319 lb 3.6 oz 316 lb 12.8 oz 314 lb 6.4 oz  Weight (kg) 144.8 kg 143.7 kg 142.611 kg     Body mass index is 51.52 kg/m.  General:  Obese male laying comfortably in the bed in no acute distress  HEENT: normal Neck: no JVD Vascular: Radial pulses 2+ bilaterally  Cardiac:  Regular rate and rhythm, S3 gallop present  Lungs: distant lung sounds, but clear to auscultation bilaterally, no wheezing, rhonchi or rales  Abd: soft, mildly tender to palpation  Ext: no edema Musculoskeletal:  No deformities, BUE and BLE strength normal and equal Skin: warm and dry, feet are cool to the touch   Neuro:  CNs 2-12 intact, no focal abnormalities noted Psych:  Normal affect   EKG:  The EKG was personally reviewed and demonstrates:  Normal Sinus Rhythm, HR 88 BPM, prolonged QT (QT/QTcB 400/484 ms)  Telemetry:  Telemetry was personally reviewed and demonstrates:  Sinus rhythm, occasional tachycardia with HR in the 100s    Relevant CV Studies:  Echocardiogram 10/30/21   1. Left ventricular ejection fraction, by estimation, is 20 to 25%. The   left ventricle has severely decreased function. The left ventricle  demonstrates global hypokinesis. The left ventricular internal cavity size  was severely dilated. There is mild  concentric left ventricular hypertrophy. Left ventricular diastolic  parameters are indeterminate.   2. Right ventricular systolic function is normal. The right ventricular  size is normal. There is moderately elevated pulmonary artery systolic  pressure.   3. The mitral valve is normal in structure. No evidence of mitral valve  regurgitation. No evidence of mitral stenosis.   4. The aortic valve is normal in structure. Aortic valve regurgitation is  not visualized. No aortic stenosis is present.   5. The inferior vena cava is normal in size with greater than 50%  respiratory variability, suggesting right atrial pressure of 3 mmHg.   Laboratory Data:  High Sensitivity Troponin:  No results for input(s): TROPONINIHS in the last 720 hours.   Chemistry Recent Labs  Lab 10/29/21 1754 10/30/21 0559 10/31/21 0512  NA 135 140 140  K 3.4* 3.4* 3.7  CL 104 109 109  CO2 22 22 23   GLUCOSE 214* 114* 109*  BUN 18 14 11   CREATININE 1.14 1.05 0.83  CALCIUM 8.2* 8.2* 8.7*  MG  --  2.0  --   GFRNONAA >60 >60 >60  ANIONGAP 9 9 8     Recent Labs  Lab 10/29/21 1754 10/30/21 0559 10/31/21 0512  PROT 7.0 6.9 7.2  ALBUMIN 3.5 3.4* 3.5  AST 1,744* 1,037* 562*  ALT 1,682* 1,350* 1,139*  ALKPHOS 133* 119 117  BILITOT 1.5* 1.8* 1.9*   Lipids No results for input(s): CHOL, TRIG, HDL, LABVLDL, LDLCALC, CHOLHDL in the last 168 hours.  Hematology Recent Labs  Lab 10/29/21 1754 10/30/21 0559 10/31/21 0512  WBC 13.2* 11.3* 9.2  RBC 6.38* 5.92* 5.91*  HGB 13.7 12.6* 12.8*  HCT 44.4 42.9 43.6  MCV 69.6* 72.5* 73.8*  MCH 21.5* 21.3* 21.7*  MCHC 30.9 29.4* 29.4*  RDW 18.4* 18.5* 18.6*  PLT 301 264 287   Thyroid  Recent Labs  Lab 10/30/21 0559  TSH 2.480    BNP Recent Labs  Lab 10/30/21 0559  BNP  559.6*    DDimer No results for input(s): DDIMER in the last 168 hours.   Radiology/Studies:  DG Abd 1 View  Result Date: 10/30/2021 CLINICAL DATA:  Abdominal pain elevated LFTs. EXAM: ABDOMEN - 1 VIEW COMPARISON:  CT Oct 29, 2021 FINDINGS: The bowel gas pattern is normal. Radiopaque excreted contrast material in the bladder. IMPRESSION: Normal bowel gas pattern. Electronically Signed   By: Maudry Mayhew M.D.   On: 10/30/2021 10:13   CT Abdomen Pelvis W Contrast  Result Date: 10/29/2021 CLINICAL DATA:  Nausea/vomiting RUQ pain. 31 y/o male. Pt reports right upper abdominal pain radiating to right back since Monday. Pain increasing and has became constant. Reports projectile vomiting today x2 episode. EXAM: CT ABDOMEN AND PELVIS WITH CONTRAST TECHNIQUE: Multidetector CT imaging of the abdomen and pelvis was performed using the standard protocol following bolus administration of intravenous contrast. RADIATION DOSE REDUCTION: This exam was performed according to the departmental dose-optimization program which includes automated exposure control, adjustment of the mA and/or kV according to patient size and/or use of iterative reconstruction technique. CONTRAST:  OMNIPAQUE IOHEXOL 300 MG/ML  SOLN COMPARISON:  None Available. FINDINGS: Lower chest: Prominent cardiac silhouette. Mild peribronchovascular ground-glass airspace opacity. No acute abnormality. Hepatobiliary: No focal liver abnormality. No gallstones, gallbladder wall thickening, or pericholecystic fluid. No biliary dilatation. Pancreas: No focal lesion. Normal pancreatic contour. No surrounding inflammatory changes. No main pancreatic ductal dilatation. Spleen: Normal in size without focal abnormality. Adrenals/Urinary Tract: No adrenal nodule bilaterally. Bilateral kidneys enhance symmetrically. No hydronephrosis. No hydroureter. The urinary bladder is unremarkable. Stomach/Bowel: Stomach is within normal limits. No evidence of bowel wall  thickening or dilatation. Appendix appears normal. Vascular/Lymphatic: No abdominal aorta or iliac aneurysm. No abdominal, pelvic, or inguinal lymphadenopathy. Reproductive: Prostate is unremarkable. Other: No intraperitoneal free fluid. No intraperitoneal free gas. No organized fluid collection. Musculoskeletal: Subcutaneus soft tissue edema.  No hernia. No suspicious lytic or blastic osseous lesions. No acute displaced fracture. Multilevel degenerative changes of the spine. IMPRESSION: 1. No acute intra-abdominal or intrapelvic abnormality. 2. Mild pulmonary edema in the setting of cardiomegaly. Electronically Signed   By: Tish Frederickson M.D.   On: 10/29/2021 22:27   DG Chest Port 1 View  Result Date: 10/29/2021 CLINICAL DATA:  Upper abdominal pain EXAM: PORTABLE CHEST 1 VIEW COMPARISON:  None Available. FINDINGS: Lungs are clear.  No pleural effusion or pneumothorax. Cardiomegaly. IMPRESSION: Cardiomegaly.  No evidence of acute cardiopulmonary disease. Electronically Signed   By: Charline Bills M.D.   On: 10/29/2021 21:17   ECHOCARDIOGRAM COMPLETE  Result Date: 10/30/2021    ECHOCARDIOGRAM REPORT   Patient Name:   Gilbert Graham Date of Exam: 10/30/2021 Medical Rec #:  [id number]      Height:       66.0 in Accession #:    [id number]     Weight:       316.8 lb Date of Birth:  1990/10/26      BSA:          2.432 m Patient Age:    31 years       BP:           123/91 mmHg Patient Gender: M              HR:           88 bpm. Exam Location:  Inpatient Procedure: 2D Echo, Cardiac Doppler and Color Doppler Indications:    CHF  History:  Patient has no prior history of Echocardiogram examinations.                 Risk Factors:Diabetes.  Sonographer:    Eduard Roux Referring Phys: [id number] BELKYS A REGALADO IMPRESSIONS  1. Left ventricular ejection fraction, by estimation, is 20 to 25%. The left ventricle has severely decreased function. The left ventricle demonstrates global hypokinesis. The left  ventricular internal cavity size was severely dilated. There is mild concentric left ventricular hypertrophy. Left ventricular diastolic parameters are indeterminate.  2. Right ventricular systolic function is normal. The right ventricular size is normal. There is moderately elevated pulmonary artery systolic pressure.  3. The mitral valve is normal in structure. No evidence of mitral valve regurgitation. No evidence of mitral stenosis.  4. The aortic valve is normal in structure. Aortic valve regurgitation is not visualized. No aortic stenosis is present.  5. The inferior vena cava is normal in size with greater than 50% respiratory variability, suggesting right atrial pressure of 3 mmHg. FINDINGS  Left Ventricle: Left ventricular ejection fraction, by estimation, is 20 to 25%. The left ventricle has severely decreased function. The left ventricle demonstrates global hypokinesis. The left ventricular internal cavity size was severely dilated. There is mild concentric left ventricular hypertrophy. Left ventricular diastolic parameters are indeterminate. Right Ventricle: The right ventricular size is normal. No increase in right ventricular wall thickness. Right ventricular systolic function is normal. There is moderately elevated pulmonary artery systolic pressure. The tricuspid regurgitant velocity is 2.77 m/s, and with an assumed right atrial pressure of 15 mmHg, the estimated right ventricular systolic pressure is 45.7 mmHg. Left Atrium: Left atrial size was normal in size. Right Atrium: Right atrial size was normal in size. Pericardium: There is no evidence of pericardial effusion. Mitral Valve: The mitral valve is normal in structure. No evidence of mitral valve regurgitation. No evidence of mitral valve stenosis. Tricuspid Valve: The tricuspid valve is normal in structure. Tricuspid valve regurgitation is not demonstrated. No evidence of tricuspid stenosis. Aortic Valve: The aortic valve is normal in  structure. Aortic valve regurgitation is not visualized. No aortic stenosis is present. Aortic valve peak gradient measures 3.3 mmHg. Pulmonic Valve: The pulmonic valve was normal in structure. Pulmonic valve regurgitation is not visualized. No evidence of pulmonic stenosis. Aorta: The aortic root is normal in size and structure. Venous: The inferior vena cava is normal in size with greater than 50% respiratory variability, suggesting right atrial pressure of 3 mmHg. IAS/Shunts: No atrial level shunt detected by color flow Doppler.  LEFT VENTRICLE PLAX 2D LVIDd:         7.00 cm LVIDs:         6.30 cm LV PW:         1.40 cm LV IVS:        1.10 cm LVOT diam:     2.00 cm LV SV:         49 LV SV Index:   20 LVOT Area:     3.14 cm  LV Volumes (MOD) LV vol d, MOD A4C: 257.0 ml LV vol s, MOD A4C: 200.0 ml LV SV MOD A4C:     257.0 ml RIGHT VENTRICLE             IVC RV Basal diam:  3.90 cm     IVC diam: 2.80 cm RV S prime:     10.20 cm/s TAPSE (M-mode): 1.7 cm LEFT ATRIUM  Index        RIGHT ATRIUM           Index LA diam:        4.70 cm 1.93 cm/m   RA Area:     22.80 cm LA Vol (A2C):   73.8 ml 30.35 ml/m  RA Volume:   74.50 ml  30.64 ml/m LA Vol (A4C):   72.2 ml 29.69 ml/m LA Biplane Vol: 79.7 ml 32.78 ml/m  AORTIC VALVE                 PULMONIC VALVE AV Area (Vmax): 3.04 cm     PV Vmax:       0.70 m/s AV Vmax:        90.50 cm/s   PV Peak grad:  2.0 mmHg AV Peak Grad:   3.3 mmHg LVOT Vmax:      87.50 cm/s LVOT Vmean:     54.700 cm/s LVOT VTI:       0.155 m  AORTA Ao Root diam: 3.20 cm Ao Asc diam:  3.00 cm MITRAL VALVE               TRICUSPID VALVE MV Area (PHT): 6.54 cm    TR Peak grad:   30.7 mmHg MV Decel Time: 116 msec    TR Vmax:        277.00 cm/s MV E velocity: 81.40 cm/s MV A velocity: 32.60 cm/s  SHUNTS MV E/A ratio:  2.50        Systemic VTI:  0.16 m                            Systemic Diam: 2.00 cm Kardie Tobb DO Electronically signed by Thomasene Ripple DO Signature Date/Time: 10/30/2021/5:00:23 PM     Final    US Abdomen Limited RUQ (LIVER/GB)  Result Date: 10/29/2021 CLINICAL DATA:  Pain EXAM: ULTRASOUND ABDOMEN LIMITED RIGHT UPPER QUADRANT COMPARISON:  None Available. FINDINGS: Gallbladder: No gallstones or wall thickening visualized. No sonographic Murphy sign noted by sonographer. Common bile duct: Diameter: 4 mm Liver: Within the upper limits of normal for parenchymal echotexture. No focal hepatic lesion is seen. Portal vein is patent on color Doppler imaging with normal direction of blood flow towards the liver. Other: None. IMPRESSION: Negative right upper quadrant ultrasound. Electronically Signed   By: Charline Bills M.D.   On: 10/29/2021 21:59     Assessment and Plan:   Chronic HFrEF  - Echo this admission above, showed EF 20-25%, severely dilated LV, normal RV systolic function  - Patient had echocardiogram in 01/2021 that showed EF 20%  - Discussed need for a cath, however patient declines at this time - Prior to admission, patient was taking carvedilol 3.125 mg BID. Hold for now given concerns of possible low output  - Start losartan 25 mg daily for now, titrate as able  - Recommend outpatient follow up with CHF clinic   Tobacco use - Educated patient on the importance of tobacco cessation   Otherwise per primary  - Elevated LFTs, right lower quadrant abdominal pain  - Morbid Obesity  - Type 2 DM   Risk Assessment/Risk Scores:        New York Heart Association (NYHA) Functional Class NYHA Class II        For questions or updates, please contact [facility] Please consult [email or web address] for contact info under    Signed, Jonita Albee, PA-C  10/31/2021  9:09 AM

## 2021-11-01 LAB — COMPREHENSIVE METABOLIC PANEL
ALT: 919 U/L — ABNORMAL HIGH (ref 0–44)
AST: 366 U/L — ABNORMAL HIGH (ref 15–41)
Albumin: 3.5 g/dL (ref 3.5–5.0)
Alkaline Phosphatase: 116 U/L (ref 38–126)
Anion gap: 6 (ref 5–15)
BUN: 11 mg/dL (ref 6–20)
CO2: 28 mmol/L (ref 22–32)
Calcium: 8.7 mg/dL — ABNORMAL LOW (ref 8.9–10.3)
Chloride: 105 mmol/L (ref 98–111)
Creatinine, Ser: 0.93 mg/dL (ref 0.61–1.24)
GFR, Estimated: >60 mL/min (ref >60)
Glucose, Bld: 138 mg/dL — ABNORMAL HIGH (ref 70–99)
Potassium: 3.2 mmol/L — ABNORMAL LOW (ref 3.5–5.1)
Sodium: 139 mmol/L (ref 135–145)
Total Bilirubin: 1.7 mg/dL — ABNORMAL HIGH (ref 0.3–1.2)
Total Protein: 7.1 g/dL (ref 6.5–8.1)

## 2021-11-01 LAB — GLUCOSE, CAPILLARY
Glucose-Capillary: 123 mg/dL — ABNORMAL HIGH (ref 70–99)
Glucose-Capillary: 137 mg/dL — ABNORMAL HIGH (ref 70–99)
Glucose-Capillary: 138 mg/dL — ABNORMAL HIGH (ref 70–99)

## 2021-11-01 MED ORDER — SENNOSIDES-DOCUSATE SODIUM 8.6-50 MG PO TABS: 1.0000 | ORAL_TABLET | Freq: Two times a day (BID) | ORAL | 0 refills | Status: AC

## 2021-11-01 MED ORDER — POTASSIUM CHLORIDE CRYS ER 20 MEQ PO TBCR: 20.0000 meq | EXTENDED_RELEASE_TABLET | Freq: Every day | ORAL | 0 refills | Status: AC

## 2021-11-01 MED ORDER — TORSEMIDE 20 MG PO TABS: 20.0000 mg | ORAL_TABLET | Freq: Every day | ORAL | 3 refills | Status: DC

## 2021-11-01 MED ORDER — TIZANIDINE HCL 4 MG PO TABS: 4.0000 mg | ORAL_TABLET | Freq: Two times a day (BID) | ORAL | 0 refills | Status: AC | PRN

## 2021-11-01 MED ORDER — POTASSIUM CHLORIDE CRYS ER 20 MEQ PO TBCR
40.0000 meq | EXTENDED_RELEASE_TABLET | Freq: Once | ORAL | Status: AC
  Administered 2021-11-01: 40 meq via ORAL
  Filled 2021-11-01: qty 2

## 2021-11-01 MED ORDER — ALBUTEROL SULFATE HFA 108 (90 BASE) MCG/ACT IN AERS: 2.0000 | INHALATION_SPRAY | Freq: Four times a day (QID) | RESPIRATORY_TRACT | 1 refills | Status: AC | PRN

## 2021-11-01 MED ORDER — PANTOPRAZOLE SODIUM 40 MG PO TBEC: 40.0000 mg | DELAYED_RELEASE_TABLET | Freq: Two times a day (BID) | ORAL | Status: DC

## 2021-11-01 MED ORDER — POLYETHYLENE GLYCOL 3350 17 G PO PACK: 17.0000 g | PACK | Freq: Two times a day (BID) | ORAL | 0 refills | Status: DC

## 2021-11-01 MED ORDER — LOSARTAN POTASSIUM 25 MG PO TABS: 25.0000 mg | ORAL_TABLET | Freq: Every day | ORAL | 3 refills | Status: DC

## 2021-11-01 NOTE — TOC Transition Note (Signed)
Transition of Care [facility]) - CM/SW Discharge Note   Patient Details  Name: Gilbert Graham MRN: [id number] Date of Birth: [birth year]  Transition of Care [facility] CM/SW Contact:  Darleene Cleaver, LCSW Phone Number: 11/01/2021, 11:17 AM   Clinical Narrative:     TOC was informed that patient has an appointment set up with Dr. Demetrio Lapping in [facility] on June 21st by RN case manager yesterday.  No other anticipated needs, TOC signing off, please reconsult if other social work needs arise.   Final next level of care: Home/Self Care Barriers to Discharge: Barriers Resolved   Patient Goals and CMS Choice Patient states their goals for this hospitalization and ongoing recovery are:: To return back home. CMS Medicare.gov Compare Post Acute Care list provided to:: Patient Choice offered to / list presented to : Patient  Discharge Placement  Home with family.                     Discharge Plan and Services                                     Social Determinants of Health (SDOH) Interventions     Readmission Risk Interventions     View : No data to display.

## 2021-11-01 NOTE — Discharge Summary (Signed)
Physician Discharge Summary   Patient: Gilbert Graham MRN: [id number] DOB: March 03, 1991  Admit date:     10/29/2021  Discharge date: 11/01/21  Discharge Physician: Alba Cory   PCP: Pcp, No   Recommendations at discharge:   Follow up with cardiology for further evaluation of Cardiomyopathy, adjustment of medications.  Needs repeat LFT. Resume statins if LFT normalized.   Discharge Diagnoses: Principal Problem:   Elevated LFTs Active Problems:   Hypokalemia   Obesity, Class III, BMI 40-49.9 (morbid obesity) (HCC)   Prolonged QT interval   Chronic systolic CHF (congestive heart failure) (HCC)   DM2 (diabetes mellitus, type 2) (HCC)   Right upper quadrant abdominal pain   Transaminitis   Acute CHF (congestive heart failure) (HCC)  Resolved Problems:   * No resolved hospital problems. [facility] Course: 31 year old with past medical history significant for chronic systolic heart failure, diabetes type 2, most recent A1c 12.01 January 2021 admitted to [facility] on 5/31 complaining of abdominal pain and acute transaminitis. He reported shortness of breath a few days prior to admission that has resolved.   Assessment and Plan: 1-Acute transaminitis: Shock liver ? Congested Hepatopathy.  Hepatitis panel non reactive.  TSH normal. CK level normal.  Abdominal US; Negative right upper quadrant [place]  GI consulted. Recommend support care.  Tylenol negative.  LFT trending down. Continue to hold statins at discharge.    Chronic Systolic  Heart failure:  ECHO: 2018: EF 55 % He report he was diagnosed with systolic HFF in [place] 2021.  ECHO. Ef 20 % Resume coreg.  Appreciate cardiology evaluation.  Started  torsemide and Cozaar.  Coreg resume.  Stable for discharge. Cardiology will arrange follow up with HF clinic.   Hypokalemia: Replaced.    Morbid Obesity: Needs life style modifications.    QT prolong: replete potassium.  EKG improved.    Diabetes Type 2:  A1c  10.4 SSI. Resume farxiga.    Right lower Quadrant abdominal pain;  No BM in 1 week.  KUB negative for obstruction.  Started  Bowel regimen.  Having BM Improved.    Bipolar, PTSD, Borderline personality disorder:  Resume lithium. Klonopin. Prozac.             Consultants: Cardiology, GI Procedures performed: None  Disposition: Home Diet recommendation:  Discharge Diet Orders (From admission, onward)     Start     Ordered   11/01/21 0000  Diet - low sodium heart healthy        11/01/21 1029           Cardiac diet DISCHARGE MEDICATION: Allergies as of 11/01/2021       Reactions   Amoxicillin Diarrhea   Imitrex [sumatriptan] Other (See Comments)   Makes migraine worse        Medication List     STOP taking these medications    atorvastatin 20 MG tablet Commonly known as: LIPITOR   metFORMIN 500 MG tablet Commonly known as: GLUCOPHAGE       TAKE these medications    albuterol 108 (90 Base) MCG/ACT inhaler Commonly known as: VENTOLIN HFA Inhale 2 puffs into the lungs every 6 (six) hours as needed for wheezing or shortness of breath.   carvedilol 3.125 MG tablet Commonly known as: COREG Take 3.125 mg by mouth 2 (two) times daily.   clonazePAM 0.5 MG tablet Commonly known as: KLONOPIN Take 0.5 mg by mouth daily as needed for anxiety.   EQ Aspirin Adult Low Dose 81  MG tablet Generic drug: aspirin EC Take 81 mg by mouth daily.   Farxiga 5 MG Tabs tablet Generic drug: dapagliflozin propanediol Take 5 mg by mouth daily.   FLUoxetine 40 MG capsule Commonly known as: PROZAC Take 40 mg by mouth daily.   lithium carbonate 450 MG CR tablet Commonly known as: ESKALITH Take 450 mg by mouth at bedtime.   losartan 25 MG tablet Commonly known as: COZAAR Take 1 tablet (25 mg total) by mouth daily. Start taking on: November 02, 2021   polyethylene glycol 17 g packet Commonly known as: MIRALAX / GLYCOLAX Take 17 g by mouth 2 (two) times daily.    potassium chloride SA 20 MEQ tablet Commonly known as: KLOR-CON M Take 1 tablet (20 mEq total) by mouth daily.   prazosin 5 MG capsule Commonly known as: MINIPRESS Take 5 mg by mouth at bedtime as needed (sleep).   pregabalin 100 MG capsule Commonly known as: LYRICA Take 100 mg by mouth 2 (two) times daily.   senna-docusate 8.6-50 MG tablet Commonly known as: Senokot-S Take 1 tablet by mouth 2 (two) times daily.   tiZANidine 4 MG tablet Commonly known as: ZANAFLEX Take 1 tablet (4 mg total) by mouth every 12 (twelve) hours as needed for up to 18 days for muscle spasms.   torsemide 20 MG tablet Commonly known as: DEMADEX Take 1 tablet (20 mg total) by mouth daily. Start taking on: November 02, 2021   traZODone 50 MG tablet Commonly known as: DESYREL Take 50 mg by mouth at bedtime as needed for sleep.        Follow-up Information     [facility] Follow up.   Specialty: Cardiology Why: The office should contact you within 2-3 business days to arrange follow-up. If you do not hear from them, please call the number provided. Contact information: [address] [id number] [place]he[place] [address] [phone]               Discharge Exam: Ceasar Mons Weights   10/30/21 0500 10/31/21 0458 11/01/21 0500  Weight: (!) 143.7 kg (!) 144.8 kg (!) 136.2 kg   General; NAD Lung CTA  Condition at discharge: stable  The results of significant diagnostics from this hospitalization (including imaging, microbiology, ancillary and laboratory) are listed below for reference.   Imaging Studies: DG Abd 1 View  Result Date: 10/30/2021 CLINICAL DATA:  Abdominal pain elevated LFTs. EXAM: ABDOMEN - 1 VIEW COMPARISON:  CT Oct 29, 2021 FINDINGS: The bowel gas pattern is normal. Radiopaque excreted contrast material in the bladder. IMPRESSION: Normal bowel gas pattern. Electronically Signed   By: Maudry Mayhew M.D.   On: 10/30/2021  10:13   CT Abdomen Pelvis W Contrast  Result Date: 10/29/2021 CLINICAL DATA:  Nausea/vomiting RUQ pain. 31 y/o male. Pt reports right upper abdominal pain radiating to right back since Monday. Pain increasing and has became constant. Reports projectile vomiting today x2 episode. EXAM: CT ABDOMEN AND PELVIS WITH CONTRAST TECHNIQUE: Multidetector CT imaging of the abdomen and pelvis was performed using the standard protocol following bolus administration of intravenous contrast. RADIATION DOSE REDUCTION: This exam was performed according to the departmental dose-optimization program which includes automated exposure control, adjustment of the mA and/or kV according to patient size and/or use of iterative reconstruction technique. CONTRAST:  OMNIPAQUE IOHEXOL 300 MG/ML  SOLN COMPARISON:  None Available. FINDINGS: Lower chest: Prominent cardiac silhouette. Mild peribronchovascular ground-glass airspace opacity. No acute abnormality. Hepatobiliary:  No focal liver abnormality. No gallstones, gallbladder wall thickening, or pericholecystic fluid. No biliary dilatation. Pancreas: No focal lesion. Normal pancreatic contour. No surrounding inflammatory changes. No main pancreatic ductal dilatation. Spleen: Normal in size without focal abnormality. Adrenals/Urinary Tract: No adrenal nodule bilaterally. Bilateral kidneys enhance symmetrically. No hydronephrosis. No hydroureter. The urinary bladder is unremarkable. Stomach/Bowel: Stomach is within normal limits. No evidence of bowel wall thickening or dilatation. Appendix appears normal. Vascular/Lymphatic: No abdominal aorta or iliac aneurysm. No abdominal, pelvic, or inguinal lymphadenopathy. Reproductive: Prostate is unremarkable. Other: No intraperitoneal free fluid. No intraperitoneal free gas. No organized fluid collection. Musculoskeletal: Subcutaneus soft tissue edema.  No hernia. No suspicious lytic or blastic osseous lesions. No acute displaced fracture.  Multilevel degenerative changes of the spine. IMPRESSION: 1. No acute intra-abdominal or intrapelvic abnormality. 2. Mild pulmonary edema in the setting of cardiomegaly. Electronically Signed   By: Tish Frederickson M.D.   On: 10/29/2021 22:27   DG Chest Port 1 View  Result Date: 10/29/2021 CLINICAL DATA:  Upper abdominal pain EXAM: PORTABLE CHEST 1 VIEW COMPARISON:  None Available. FINDINGS: Lungs are clear.  No pleural effusion or pneumothorax. Cardiomegaly. IMPRESSION: Cardiomegaly.  No evidence of acute cardiopulmonary disease. Electronically Signed   By: Charline Bills M.D.   On: 10/29/2021 21:17   ECHOCARDIOGRAM COMPLETE  Result Date: 10/30/2021    ECHOCARDIOGRAM REPORT   Patient Name:   SABEN DONIGAN Date of Exam: 10/30/2021 Medical Rec #:  [id number]      Height:       66.0 in Accession #:    [id number]     Weight:       316.8 lb Date of Birth:  September 20, 1990      BSA:          2.432 m Patient Age:    31 years       BP:           123/91 mmHg Patient Gender: M              HR:           88 bpm. Exam Location:  Inpatient Procedure: 2D Echo, Cardiac Doppler and Color Doppler Indications:    CHF  History:        Patient has no prior history of Echocardiogram examinations.                 Risk Factors:Diabetes.  Sonographer:    Eduard Roux Referring Phys: [phone] Azizah Lisle A Torell Minder IMPRESSIONS  1. Left ventricular ejection fraction, by estimation, is 20 to 25%. The left ventricle has severely decreased function. The left ventricle demonstrates global hypokinesis. The left ventricular internal cavity size was severely dilated. There is mild concentric left ventricular hypertrophy. Left ventricular diastolic parameters are indeterminate.  2. Right ventricular systolic function is normal. The right ventricular size is normal. There is moderately elevated pulmonary artery systolic pressure.  3. The mitral valve is normal in structure. No evidence of mitral valve regurgitation. No evidence of mitral stenosis.  4.  The aortic valve is normal in structure. Aortic valve regurgitation is not visualized. No aortic stenosis is present.  5. The inferior vena cava is normal in size with greater than 50% respiratory variability, suggesting right atrial pressure of 3 mmHg. FINDINGS  Left Ventricle: Left ventricular ejection fraction, by estimation, is 20 to 25%. The left ventricle has severely decreased function. The left ventricle demonstrates global hypokinesis. The left ventricular internal cavity size was severely dilated. There is mild concentric  left ventricular hypertrophy. Left ventricular diastolic parameters are indeterminate. Right Ventricle: The right ventricular size is normal. No increase in right ventricular wall thickness. Right ventricular systolic function is normal. There is moderately elevated pulmonary artery systolic pressure. The tricuspid regurgitant velocity is 2.77 m/s, and with an assumed right atrial pressure of 15 mmHg, the estimated right ventricular systolic pressure is 45.7 mmHg. Left Atrium: Left atrial size was normal in size. Right Atrium: Right atrial size was normal in size. Pericardium: There is no evidence of pericardial effusion. Mitral Valve: The mitral valve is normal in structure. No evidence of mitral valve regurgitation. No evidence of mitral valve stenosis. Tricuspid Valve: The tricuspid valve is normal in structure. Tricuspid valve regurgitation is not demonstrated. No evidence of tricuspid stenosis. Aortic Valve: The aortic valve is normal in structure. Aortic valve regurgitation is not visualized. No aortic stenosis is present. Aortic valve peak gradient measures 3.3 mmHg. Pulmonic Valve: The pulmonic valve was normal in structure. Pulmonic valve regurgitation is not visualized. No evidence of pulmonic stenosis. Aorta: The aortic root is normal in size and structure. Venous: The inferior vena cava is normal in size with greater than 50% respiratory variability, suggesting right atrial  pressure of 3 mmHg. IAS/Shunts: No atrial level shunt detected by color flow Doppler.  LEFT VENTRICLE PLAX 2D LVIDd:         7.00 cm LVIDs:         6.30 cm LV PW:         1.40 cm LV IVS:        1.10 cm LVOT diam:     2.00 cm LV SV:         49 LV SV Index:   20 LVOT Area:     3.14 cm  LV Volumes (MOD) LV vol d, MOD A4C: 257.0 ml LV vol s, MOD A4C: 200.0 ml LV SV MOD A4C:     257.0 ml RIGHT VENTRICLE             IVC RV Basal diam:  3.90 cm     IVC diam: 2.80 cm RV S prime:     10.20 cm/s TAPSE (M-mode): 1.7 cm LEFT ATRIUM             Index        RIGHT ATRIUM           Index LA diam:        4.70 cm 1.93 cm/m   RA Area:     22.80 cm LA Vol (A2C):   73.8 ml 30.35 ml/m  RA Volume:   74.50 ml  30.64 ml/m LA Vol (A4C):   72.2 ml 29.69 ml/m LA Biplane Vol: 79.7 ml 32.78 ml/m  AORTIC VALVE                 PULMONIC VALVE AV Area (Vmax): 3.04 cm     PV Vmax:       0.70 m/s AV Vmax:        90.50 cm/s   PV Peak grad:  2.0 mmHg AV Peak Grad:   3.3 mmHg LVOT Vmax:      87.50 cm/s LVOT Vmean:     54.700 cm/s LVOT VTI:       0.155 m  AORTA Ao Root diam: 3.20 cm Ao Asc diam:  3.00 cm MITRAL VALVE               TRICUSPID VALVE MV Area (PHT): 6.54 cm    TR  Peak grad:   30.7 mmHg MV Decel Time: 116 msec    TR Vmax:        277.00 cm/s MV E velocity: 81.40 cm/s MV A velocity: 32.60 cm/s  SHUNTS MV E/A ratio:  2.50        Systemic VTI:  0.16 m                            Systemic Diam: 2.00 cm Kardie Tobb DO Electronically signed by Thomasene Ripple DO Signature Date/Time: 10/30/2021/5:00:23 PM    Final    US Abdomen Limited RUQ (LIVER/GB)  Result Date: 10/29/2021 CLINICAL DATA:  Pain EXAM: ULTRASOUND ABDOMEN LIMITED RIGHT UPPER QUADRANT COMPARISON:  None Available. FINDINGS: Gallbladder: No gallstones or wall thickening visualized. No sonographic Murphy sign noted by sonographer. Common bile duct: Diameter: 4 mm Liver: Within the upper limits of normal for parenchymal echotexture. No focal hepatic lesion is seen. Portal vein is  patent on color Doppler imaging with normal direction of blood flow towards the liver. Other: None. IMPRESSION: Negative right upper quadrant ultrasound. Electronically Signed   By: Charline Bills M.D.   On: 10/29/2021 21:59    Microbiology: Results for orders placed or performed during the hospital encounter of 10/29/21  Blood Culture (routine x 2)     Status: None (Preliminary result)   Collection Time: 10/29/21  8:50 PM   Specimen: BLOOD  Result Value Ref Range Status   Specimen Description   Final    BLOOD RIGHT ANTECUBITAL Performed at [facility], [address] [place], [place] [address]    Special Requests   Final    BOTTLES DRAWN AEROBIC AND ANAEROBIC Blood Culture adequate volume Performed at [facility], [address] [place], [place] [address]    Culture   Final    NO GROWTH 3 DAYS Performed at [facility], [address] [place], [place] [address]    Report Status PENDING  Incomplete  Blood Culture (routine x 2)     Status: None (Preliminary result)   Collection Time: 10/29/21  9:10 PM   Specimen: BLOOD  Result Value Ref Range Status   Specimen Description   Final    BLOOD LEFT ANTECUBITAL Performed at [facility], [address] [place], [place] [address]    Special Requests   Final    BOTTLES DRAWN AEROBIC AND ANAEROBIC Blood Culture adequate volume Performed at [facility], [address] [place], [place] [address]    Culture   Final    NO GROWTH 3 DAYS Performed at [facility], [address], [place], [place] [address]    Report Status PENDING  Incomplete  SARS Coronavirus 2 by RT PCR (hospital order, performed in [facility] hospital lab) *cepheid single result test* Anterior Nasal Swab     Status: None   Collection Time: 10/30/21  4:38 AM   Specimen: Anterior Nasal Swab  Result Value Ref Range Status   SARS Coronavirus 2 by RT PCR NEGATIVE NEGATIVE Final    Comment: (NOTE) SARS-CoV-2  target nucleic acids are NOT DETECTED.  The SARS-CoV-2 RNA is generally detectable in upper and lower respiratory specimens during the acute phase of infection. The lowest concentration of SARS-CoV-2 viral copies this assay can detect is 250 copies / mL. A negative result does not preclude SARS-CoV-2 infection and should not be used as the  sole basis for treatment or other patient management decisions.  A negative result may occur with improper specimen collection / handling, submission of specimen other than nasopharyngeal swab, presence of viral mutation(s) within the areas targeted by this assay, and inadequate number of viral copies (<250 copies / mL). A negative result must be combined with clinical observations, patient history, and epidemiological information.  Fact Sheet for Patients:   RoadLapTop.co.za  Fact Sheet for Healthcare Providers: http://kim-miller.com/  This test is not yet approved or  cleared by the [place] FDA and has been authorized for detection and/or diagnosis of SARS-CoV-2 by FDA under an Emergency Use Authorization (EUA).  This EUA will remain in effect (meaning this test can be used) for the duration of the COVID-19 declaration under Section 564(b)(1) of the Act, 21 U.S.C. section 360bbb-3(b)(1), unless the authorization is terminated or revoked sooner.  Performed at [facility], [address], [place], [place] [address]   Respiratory (~20 pathogens) panel by PCR     Status: None   Collection Time: 10/30/21  4:38 AM   Specimen: Nasopharyngeal Swab; Respiratory  Result Value Ref Range Status   Adenovirus NOT DETECTED NOT DETECTED Final   Coronavirus 229E NOT DETECTED NOT DETECTED Final    Comment: (NOTE) The Coronavirus on the Respiratory Panel, DOES NOT test for the novel  Coronavirus (2019 nCoV)    Coronavirus HKU1 NOT DETECTED NOT DETECTED Final   Coronavirus NL63 NOT  DETECTED NOT DETECTED Final   Coronavirus OC43 NOT DETECTED NOT DETECTED Final   Metapneumovirus NOT DETECTED NOT DETECTED Final   Rhinovirus / Enterovirus NOT DETECTED NOT DETECTED Final   Influenza A NOT DETECTED NOT DETECTED Final   Influenza B NOT DETECTED NOT DETECTED Final   Parainfluenza Virus 1 NOT DETECTED NOT DETECTED Final   Parainfluenza Virus 2 NOT DETECTED NOT DETECTED Final   Parainfluenza Virus 3 NOT DETECTED NOT DETECTED Final   Parainfluenza Virus 4 NOT DETECTED NOT DETECTED Final   Respiratory Syncytial Virus NOT DETECTED NOT DETECTED Final   Bordetella pertussis NOT DETECTED NOT DETECTED Final   Bordetella Parapertussis NOT DETECTED NOT DETECTED Final   Chlamydophila pneumoniae NOT DETECTED NOT DETECTED Final   Mycoplasma pneumoniae NOT DETECTED NOT DETECTED Final    Comment: Performed at [facility], [address], [place], [place] [address]    Labs: CBC: Recent Labs  Lab 10/29/21 1754 10/30/21 0559 10/31/21 0512  WBC 13.2* 11.3* 9.2  NEUTROABS  --  7.3  --   HGB 13.7 12.6* 12.8*  HCT 44.4 42.9 43.6  MCV 69.6* 72.5* 73.8*  PLT 301 264 287   Basic Metabolic Panel: Recent Labs  Lab 10/29/21 1754 10/30/21 0559 10/31/21 0512 11/01/21 0606  NA 135 140 140 139  K 3.4* 3.4* 3.7 3.2*  CL 104 109 109 105  CO2 GLUCOSE 214* 114* 109* 138*  BUN CREATININE 1.14 1.05 0.83 0.93  CALCIUM 8.2* 8.2* 8.7* 8.7*  MG  --  2.0  --   --    Liver Function Tests: Recent Labs  Lab 10/29/21 1754 10/30/21 0559 10/31/21 0512 11/01/21 0606  AST 1,744* 1,037* 562* 366*  ALT 1,682* 1,350* 1,139* 919*  ALKPHOS 133* 119 117 116  BILITOT 1.5* 1.8* 1.9* 1.7*  PROT 7.0 6.9 7.2 7.1  ALBUMIN 3.5 3.4* 3.5 3.5   CBG: Recent Labs  Lab 10/31/21 1138 10/31/21 1723 11/01/21 0017 11/01/21 0552 11/01/21 1108  GLUCAP 188* 150* 123* 138* 137*    Discharge time spent: greater than 30 minutes.  Signed: Alba Cory, MD [facility] 11/01/2021

## 2021-11-01 NOTE — Plan of Care (Signed)

## 2021-11-02 ENCOUNTER — Other Ambulatory Visit: Payer: Self-pay

## 2021-11-02 ENCOUNTER — Emergency Department (HOSPITAL_BASED_OUTPATIENT_CLINIC_OR_DEPARTMENT_OTHER)
Admission: EM | Admit: 2021-11-02 | Discharge: 2021-11-03 | Disposition: A | Discharge status: Home / Self Care | Payer: 59 | Attending: Emergency Medicine | Admitting: Emergency Medicine

## 2021-11-02 ENCOUNTER — Encounter (HOSPITAL_BASED_OUTPATIENT_CLINIC_OR_DEPARTMENT_OTHER): Payer: Self-pay | Admitting: Emergency Medicine

## 2021-11-02 DIAGNOSIS — I110 Hypertensive heart disease with heart failure: Secondary | ICD-10-CM | POA: Diagnosis not present

## 2021-11-02 DIAGNOSIS — I509 Heart failure, unspecified: Secondary | ICD-10-CM | POA: Insufficient documentation

## 2021-11-02 DIAGNOSIS — R531 Weakness: Secondary | ICD-10-CM | POA: Diagnosis not present

## 2021-11-02 DIAGNOSIS — I959 Hypotension, unspecified: Secondary | ICD-10-CM | POA: Insufficient documentation

## 2021-11-02 DIAGNOSIS — Z7982 Long term (current) use of aspirin: Secondary | ICD-10-CM | POA: Insufficient documentation

## 2021-11-02 DIAGNOSIS — E119 Type 2 diabetes mellitus without complications: Secondary | ICD-10-CM | POA: Diagnosis not present

## 2021-11-02 DIAGNOSIS — R1084 Generalized abdominal pain: Secondary | ICD-10-CM | POA: Diagnosis not present

## 2021-11-02 DIAGNOSIS — Z79899 Other long term (current) drug therapy: Secondary | ICD-10-CM | POA: Insufficient documentation

## 2021-11-02 LAB — COMPREHENSIVE METABOLIC PANEL
ALT: 548 U/L — ABNORMAL HIGH (ref 0–44)
AST: 131 U/L — ABNORMAL HIGH (ref 15–41)
Albumin: 3.3 g/dL — ABNORMAL LOW (ref 3.5–5.0)
Alkaline Phosphatase: 93 U/L (ref 38–126)
Anion gap: 7 (ref 5–15)
BUN: 14 mg/dL (ref 6–20)
CO2: 29 mmol/L (ref 22–32)
Calcium: 8.5 mg/dL — ABNORMAL LOW (ref 8.9–10.3)
Chloride: 104 mmol/L (ref 98–111)
Creatinine, Ser: 1.1 mg/dL (ref 0.61–1.24)
GFR, Estimated: >60 mL/min (ref >60)
Glucose, Bld: 144 mg/dL — ABNORMAL HIGH (ref 70–99)
Potassium: 3.6 mmol/L (ref 3.5–5.1)
Sodium: 140 mmol/L (ref 135–145)
Total Bilirubin: 1.3 mg/dL — ABNORMAL HIGH (ref 0.3–1.2)
Total Protein: 6.9 g/dL (ref 6.5–8.1)

## 2021-11-02 LAB — CBC WITH DIFFERENTIAL/PLATELET
Abs Immature Granulocytes: 0.04 10*3/uL (ref 0.00–0.07)
Basophils Absolute: 0.1 10*3/uL (ref 0.0–0.1)
Basophils Relative: 1 %
Eosinophils Absolute: 0.2 10*3/uL (ref 0.0–0.5)
Eosinophils Relative: 2 %
HCT: 41.7 % (ref 39.0–52.0)
Hemoglobin: 13 g/dL (ref 13.0–17.0)
Immature Granulocytes: 0 %
Lymphocytes Relative: 22 %
Lymphs Abs: 2.1 10*3/uL (ref 0.7–4.0)
MCH: 21.7 pg — ABNORMAL LOW (ref 26.0–34.0)
MCHC: 31.2 g/dL (ref 30.0–36.0)
MCV: 69.5 fL — ABNORMAL LOW (ref 80.0–100.0)
Monocytes Absolute: 1.1 10*3/uL — ABNORMAL HIGH (ref 0.1–1.0)
Monocytes Relative: 11 %
Neutro Abs: 6.2 10*3/uL (ref 1.7–7.7)
Neutrophils Relative %: 64 %
Platelets: 324 10*3/uL (ref 150–400)
RBC: 6 MIL/uL — ABNORMAL HIGH (ref 4.22–5.81)
RDW: 18.1 % — ABNORMAL HIGH (ref 11.5–15.5)
WBC: 9.7 10*3/uL (ref 4.0–10.5)
nRBC: 0.3 % — ABNORMAL HIGH (ref 0.0–0.2)

## 2021-11-02 LAB — LIPASE, BLOOD: Lipase: 36 U/L (ref 11–51)

## 2021-11-02 MED ORDER — SODIUM CHLORIDE 0.9 % IV BOLUS
1000.0000 mL | Freq: Once | INTRAVENOUS | Status: AC
  Administered 2021-11-02: 1000 mL via INTRAVENOUS

## 2021-11-02 MED ORDER — FENTANYL CITRATE PF 50 MCG/ML IJ SOSY: 50.0000 ug | PREFILLED_SYRINGE | Freq: Once | INTRAMUSCULAR | Status: DC

## 2021-11-02 MED ORDER — LACTATED RINGERS IV BOLUS
1000.0000 mL | Freq: Once | INTRAVENOUS | Status: AC
  Administered 2021-11-02: 1000 mL via INTRAVENOUS

## 2021-11-02 NOTE — ED Notes (Signed)
Taryn, lab, notified of add-on order.

## 2021-11-02 NOTE — ED Provider Notes (Signed)
[facility] EMERGENCY DEPARTMENT  Provider Note  CSN: [id number] Arrival date & time: 11/02/21 2213  History Chief Complaint  Patient presents with   Abdominal Pain    Gilbert Graham is a 31 y.o. male with history of CHF, HTN, DM, admitted a few days ago for elevated LFTs of unclear etiology. He was also hypotensive during that time and felt to perhaps have had shock liver. No clear etiology for his hypotension, initially thought to be sepsis but no source found and cultures have been No Growth so far. His BP and LFTs had improved prior to discharge but he reports his pain has been persistent, mostly on the right abdomen with generalized weakness. Denies any chest pain or SOB. No fevers. Some vomiting, no diarrhea.    Home Medications Prior to Admission medications   Medication Sig Start Date End Date Taking? Authorizing Provider  albuterol (VENTOLIN HFA) 108 (90 Base) MCG/ACT inhaler Inhale 2 puffs into the lungs every 6 (six) hours as needed for wheezing or shortness of breath. 11/01/21   Regalado, Belkys A, MD  carvedilol (COREG) 3.125 MG tablet Take 3.125 mg by mouth 2 (two) times daily. 10/21/21   [provider]  clonazePAM (KLONOPIN) 0.5 MG tablet Take 0.5 mg by mouth daily as needed for anxiety. 10/13/21   [provider]  EQ ASPIRIN ADULT LOW DOSE 81 MG tablet Take 81 mg by mouth daily. 08/20/21   [provider]  FARXIGA 5 MG TABS tablet Take 5 mg by mouth daily. 09/25/21   [provider]  FLUoxetine (PROZAC) 40 MG capsule Take 40 mg by mouth daily. 10/13/21   [provider]  lithium carbonate (ESKALITH) 450 MG CR tablet Take 450 mg by mouth at bedtime. 10/13/21   [provider]  losartan (COZAAR) 25 MG tablet Take 1 tablet (25 mg total) by mouth daily. 11/02/21   Regalado, Belkys A, MD  polyethylene glycol (MIRALAX / GLYCOLAX) 17 g packet Take 17 g by mouth 2 (two) times daily. 11/01/21   Regalado, Belkys A, MD  potassium  chloride SA (KLOR-CON M) 20 MEQ tablet Take 1 tablet (20 mEq total) by mouth daily. 11/01/21   Regalado, Belkys A, MD  prazosin (MINIPRESS) 5 MG capsule Take 5 mg by mouth at bedtime as needed (sleep). 09/04/21   [provider]  pregabalin (LYRICA) 100 MG capsule Take 100 mg by mouth 2 (two) times daily. 09/18/21   [provider]  senna-docusate (SENOKOT-S) 8.6-50 MG tablet Take 1 tablet by mouth 2 (two) times daily. 11/01/21   Regalado, Belkys A, MD  tiZANidine (ZANAFLEX) 4 MG tablet Take 1 tablet (4 mg total) by mouth every 12 (twelve) hours as needed for up to 18 days for muscle spasms. 11/01/21 11/19/21  Regalado, Belkys A, MD  torsemide (DEMADEX) 20 MG tablet Take 1 tablet (20 mg total) by mouth daily. 11/02/21   Regalado, Belkys A, MD  traZODone (DESYREL) 50 MG tablet Take 50 mg by mouth at bedtime as needed for sleep. 09/04/21   [provider]     Allergies    Amoxicillin and Imitrex [sumatriptan]   Review of Systems   Review of Systems Please see HPI for pertinent positives and negatives  Physical Exam BP 99/65   Pulse 83   Temp 98.9 F (37.2 C) (Oral)   Resp 15   Ht 5\' 6"  (1.676 m)   Wt (!) 141.5 kg   SpO2 98%   BMI 50.36 kg/m  Physical Exam Vitals and nursing note reviewed.  Constitutional:      Appearance: Normal appearance.  HENT:     Head: Normocephalic and atraumatic.     Nose: Nose normal.     Mouth/Throat:     Mouth: Mucous membranes are moist.  Eyes:     Extraocular Movements: Extraocular movements intact.     Conjunctiva/sclera: Conjunctivae normal.  Cardiovascular:     Rate and Rhythm: Normal rate.  Pulmonary:     Effort: Pulmonary effort is normal.     Breath sounds: Normal breath sounds.  Abdominal:     General: Abdomen is flat.     Palpations: Abdomen is soft.     Tenderness: There is generalized abdominal tenderness. There is no guarding. Negative signs include Murphy's sign and McBurney's sign.  Musculoskeletal:         General: No swelling. Normal range of motion.     Cervical back: Neck supple.  Skin:    General: Skin is warm and dry.  Neurological:     General: No focal deficit present.     Mental Status: He is alert.  Psychiatric:        Mood and Affect: Mood normal.    ED Results / Procedures / Treatments   EKG EKG Interpretation  Date/Time:  Sunday November 02 2021 23:14:10 EDT Ventricular Rate:  88 PR Interval:  157 QRS Duration: 104 QT Interval:  409 QTC Calculation: 495 R Axis:   59 Text Interpretation: Sinus rhythm Probable left atrial enlargement Borderline low voltage, extremity leads Prolonged QT interval No significant change since last tracing Confirmed by Susy Frizzle [phone]) on 11/02/2021 11:29:53 PM  Procedures Procedures  Medications Ordered in the ED Medications  fentaNYL (SUBLIMAZE) injection 50 mcg (0 mcg Intravenous Hold 11/03/21 0134)  sodium chloride 0.9 % bolus 1,000 mL (0 mLs Intravenous Stopped 11/03/21 0013)  lactated ringers bolus 1,000 mL (0 mLs Intravenous Stopped 11/03/21 0111)  lactated ringers bolus 1,000 mL (1,000 mLs Intravenous New Bag/Given 11/03/21 0153)    Initial Impression and Plan  Patient here with persistent abdominal pain, recent admission for transaminitis of unclear etiology. Still having pain. BP low initially, improved on recheck, getting IVF bolus now. Will check labs. Pain meds for comfort.   ED Course   Clinical Course as of 11/03/21 0406  Wynelle Link Nov 02, 2021  2330 Correction to above: patient's BP was not improved, that appears to have been a single spurious value. Has been persistently in the upper 80s.  [CS]  2335 CBC is unremarkable.  [CS]  Mon Nov 03, 2021  0001 CMP with continued improvement in LFTs. Lipase is normal.  [CS]  0002 Lactic acid is normal.  [CS]  0003 Patient is on lithium, no recent changes in dose or accidental/intentional overdose. Will check level as that does not appear to have been done during recent admission.  [CS]   0114 BP is improving. Continue to monitor. Respiratory status is stable.  [CS]  0245 BP continues to improve with third liter. Awaiting Lithium level, otherwise ED workup is improved from recent admission. [CS]  0258 Lithium level is not elevated. Consider that his recent liver issues have affected metabolism of some of his other medications (BP, sedatives, etc) which may be affecting his blood pressure. Will add UA and UDS to complete workup.  [CS]  0343 Patient's BP continues to improve but he reports he is not feeling better. Will discuss re-admission with Hospitalist.  [CS]  [phone] Patient now states  he wants to go home. Will check his symptoms with walking and if he is able to tolerate walking without feeling lightheaded dizzy, will discuss holding medications and close outpatient follow up.  [CS]  0404 Patient able to ambulate without dizziness and wants to go home. Recommend close PCP follow up. Hold BP and other sedating meds which may lower BP. RTED for any other acute concerns.  [CS]    Clinical Course User Index [CS] Pollyann Savoy, MD     MDM Rules/Calculators/A&P Medical Decision Making Given presenting complaint, I considered that admission might be necessary. After review of results from ED lab and/or imaging studies, admission to the hospital is not indicated at this time.    Problems Addressed: Generalized abdominal pain: acute illness or injury Hypotension, unspecified hypotension type: acute illness or injury  Amount and/or Complexity of Data Reviewed Labs: ordered. Decision-making details documented in ED Course. ECG/medicine tests: ordered and independent interpretation performed. Decision-making details documented in ED Course.  Risk Prescription drug management. Decision regarding hospitalization.    Final Clinical Impression(s) / ED Diagnoses Final diagnoses:  Generalized abdominal pain  Hypotension, unspecified hypotension type    Rx / DC Orders ED  Discharge Orders     None        Pollyann Savoy, MD 11/03/21 [phone]

## 2021-11-02 NOTE — ED Triage Notes (Signed)
Pt c/o RUQ pain w/ radiation to back; +nausea; he was recently admitted for same, sts pain never went away

## 2021-11-03 LAB — URINALYSIS, ROUTINE W REFLEX MICROSCOPIC
Bilirubin Urine: NEGATIVE
Glucose, UA: >=500 mg/dL — AB
Hgb urine dipstick: NEGATIVE
Ketones, ur: NEGATIVE mg/dL
Leukocytes,Ua: NEGATIVE
Nitrite: NEGATIVE
Protein, ur: NEGATIVE mg/dL
Specific Gravity, Urine: 1.01 (ref 1.005–1.030)
pH: 6 (ref 5.0–8.0)

## 2021-11-03 LAB — CULTURE, BLOOD (ROUTINE X 2)
Culture: NO GROWTH
Culture: NO GROWTH
Special Requests: ADEQUATE
Special Requests: ADEQUATE

## 2021-11-03 LAB — RAPID URINE DRUG SCREEN, HOSP PERFORMED
Amphetamines: NOT DETECTED
Barbiturates: NOT DETECTED
Benzodiazepines: NOT DETECTED
Cocaine: NOT DETECTED
Opiates: NOT DETECTED
Tetrahydrocannabinol: POSITIVE — AB

## 2021-11-03 LAB — LITHIUM LEVEL: Lithium Lvl: 0.12 mmol/L — ABNORMAL LOW (ref 0.60–1.20)

## 2021-11-03 LAB — URINALYSIS, MICROSCOPIC (REFLEX)

## 2021-11-03 LAB — LACTIC ACID, PLASMA: Lactic Acid, Venous: 1.4 mmol/L (ref 0.5–1.9)

## 2021-11-03 MED ORDER — LACTATED RINGERS IV BOLUS
1000.0000 mL | Freq: Once | INTRAVENOUS | Status: AC
Start: 2021-11-03 — End: 2021-11-03
  Administered 2021-11-03: 1000 mL via INTRAVENOUS

## 2021-11-03 NOTE — Discharge Instructions (Signed)
Please hold your blood pressure medications until you are able to follow up with your PCP for a recheck.

## 2021-11-12 DIAGNOSIS — R69 Illness, unspecified: Secondary | ICD-10-CM | POA: Diagnosis not present

## 2021-11-12 DIAGNOSIS — R1084 Generalized abdominal pain: Secondary | ICD-10-CM | POA: Diagnosis not present

## 2021-11-12 DIAGNOSIS — I5022 Chronic systolic (congestive) heart failure: Secondary | ICD-10-CM | POA: Diagnosis not present

## 2021-11-12 DIAGNOSIS — E1143 Type 2 diabetes mellitus with diabetic autonomic (poly)neuropathy: Secondary | ICD-10-CM | POA: Diagnosis not present

## 2021-11-12 DIAGNOSIS — R7989 Other specified abnormal findings of blood chemistry: Secondary | ICD-10-CM | POA: Diagnosis not present

## 2021-11-12 DIAGNOSIS — Z794 Long term (current) use of insulin: Secondary | ICD-10-CM | POA: Diagnosis not present

## 2021-11-12 DIAGNOSIS — I10 Essential (primary) hypertension: Secondary | ICD-10-CM | POA: Diagnosis not present

## 2021-11-16 DIAGNOSIS — R109 Unspecified abdominal pain: Secondary | ICD-10-CM | POA: Diagnosis not present

## 2021-11-16 DIAGNOSIS — K922 Gastrointestinal hemorrhage, unspecified: Secondary | ICD-10-CM | POA: Diagnosis not present

## 2021-11-16 DIAGNOSIS — R1013 Epigastric pain: Secondary | ICD-10-CM | POA: Diagnosis not present

## 2021-11-16 DIAGNOSIS — I10 Essential (primary) hypertension: Secondary | ICD-10-CM | POA: Diagnosis not present

## 2021-11-16 DIAGNOSIS — R112 Nausea with vomiting, unspecified: Secondary | ICD-10-CM | POA: Diagnosis not present

## 2021-11-16 DIAGNOSIS — R1010 Upper abdominal pain, unspecified: Secondary | ICD-10-CM | POA: Diagnosis not present

## 2021-11-17 ENCOUNTER — Encounter (HOSPITAL_COMMUNITY): Payer: 59

## 2021-11-19 DIAGNOSIS — K219 Gastro-esophageal reflux disease without esophagitis: Secondary | ICD-10-CM | POA: Diagnosis not present

## 2021-11-19 DIAGNOSIS — M159 Polyosteoarthritis, unspecified: Secondary | ICD-10-CM | POA: Diagnosis not present

## 2021-11-19 DIAGNOSIS — I10 Essential (primary) hypertension: Secondary | ICD-10-CM | POA: Diagnosis not present

## 2021-11-19 DIAGNOSIS — I509 Heart failure, unspecified: Secondary | ICD-10-CM | POA: Diagnosis not present

## 2021-11-19 DIAGNOSIS — E1169 Type 2 diabetes mellitus with other specified complication: Secondary | ICD-10-CM | POA: Diagnosis not present

## 2021-11-19 DIAGNOSIS — R69 Illness, unspecified: Secondary | ICD-10-CM | POA: Diagnosis not present

## 2021-11-19 DIAGNOSIS — F5101 Primary insomnia: Secondary | ICD-10-CM | POA: Diagnosis not present

## 2021-11-19 DIAGNOSIS — K273 Acute peptic ulcer, site unspecified, without hemorrhage or perforation: Secondary | ICD-10-CM | POA: Diagnosis not present

## 2021-11-19 DIAGNOSIS — E785 Hyperlipidemia, unspecified: Secondary | ICD-10-CM | POA: Diagnosis not present

## 2021-11-19 DIAGNOSIS — E1140 Type 2 diabetes mellitus with diabetic neuropathy, unspecified: Secondary | ICD-10-CM | POA: Diagnosis not present

## 2021-11-19 DIAGNOSIS — R5382 Chronic fatigue, unspecified: Secondary | ICD-10-CM | POA: Diagnosis not present

## 2021-11-20 DIAGNOSIS — K219 Gastro-esophageal reflux disease without esophagitis: Secondary | ICD-10-CM | POA: Diagnosis not present

## 2021-11-20 DIAGNOSIS — K625 Hemorrhage of anus and rectum: Secondary | ICD-10-CM | POA: Diagnosis not present

## 2021-11-20 DIAGNOSIS — R109 Unspecified abdominal pain: Secondary | ICD-10-CM | POA: Diagnosis not present

## 2021-11-20 DIAGNOSIS — K5900 Constipation, unspecified: Secondary | ICD-10-CM | POA: Diagnosis not present

## 2021-11-26 DIAGNOSIS — F5101 Primary insomnia: Secondary | ICD-10-CM | POA: Diagnosis not present

## 2021-11-26 DIAGNOSIS — E1140 Type 2 diabetes mellitus with diabetic neuropathy, unspecified: Secondary | ICD-10-CM | POA: Diagnosis not present

## 2021-11-26 DIAGNOSIS — R809 Proteinuria, unspecified: Secondary | ICD-10-CM | POA: Diagnosis not present

## 2021-11-26 DIAGNOSIS — I10 Essential (primary) hypertension: Secondary | ICD-10-CM | POA: Diagnosis not present

## 2021-11-26 DIAGNOSIS — I509 Heart failure, unspecified: Secondary | ICD-10-CM | POA: Diagnosis not present

## 2021-11-26 DIAGNOSIS — K273 Acute peptic ulcer, site unspecified, without hemorrhage or perforation: Secondary | ICD-10-CM | POA: Diagnosis not present

## 2021-11-26 DIAGNOSIS — E785 Hyperlipidemia, unspecified: Secondary | ICD-10-CM | POA: Diagnosis not present

## 2021-11-26 DIAGNOSIS — E1169 Type 2 diabetes mellitus with other specified complication: Secondary | ICD-10-CM | POA: Diagnosis not present

## 2021-11-26 DIAGNOSIS — R69 Illness, unspecified: Secondary | ICD-10-CM | POA: Diagnosis not present

## 2021-11-26 DIAGNOSIS — M159 Polyosteoarthritis, unspecified: Secondary | ICD-10-CM | POA: Diagnosis not present

## 2021-11-26 DIAGNOSIS — F4310 Post-traumatic stress disorder, unspecified: Secondary | ICD-10-CM | POA: Diagnosis not present

## 2021-12-01 DIAGNOSIS — K922 Gastrointestinal hemorrhage, unspecified: Secondary | ICD-10-CM | POA: Diagnosis not present

## 2021-12-01 DIAGNOSIS — K648 Other hemorrhoids: Secondary | ICD-10-CM | POA: Diagnosis not present

## 2021-12-10 ENCOUNTER — Other Ambulatory Visit (HOSPITAL_COMMUNITY): Payer: Self-pay

## 2021-12-10 ENCOUNTER — Encounter (HOSPITAL_COMMUNITY): Payer: Self-pay

## 2021-12-10 ENCOUNTER — Ambulatory Visit (HOSPITAL_COMMUNITY)
Admission: RE | Admit: 2021-12-10 | Discharge: 2021-12-10 | Disposition: A | Discharge status: Home / Self Care | Payer: 59 | Source: Ambulatory Visit | Attending: Cardiology | Admitting: Cardiology

## 2021-12-10 VITALS — BP 136/104 | HR 104 | Ht 66.0 in | Wt 304.2 lb

## 2021-12-10 DIAGNOSIS — Z8249 Family history of ischemic heart disease and other diseases of the circulatory system: Secondary | ICD-10-CM | POA: Diagnosis not present

## 2021-12-10 DIAGNOSIS — F4310 Post-traumatic stress disorder, unspecified: Secondary | ICD-10-CM | POA: Insufficient documentation

## 2021-12-10 DIAGNOSIS — R6881 Early satiety: Secondary | ICD-10-CM | POA: Insufficient documentation

## 2021-12-10 DIAGNOSIS — R0789 Other chest pain: Secondary | ICD-10-CM | POA: Insufficient documentation

## 2021-12-10 DIAGNOSIS — R69 Illness, unspecified: Secondary | ICD-10-CM | POA: Diagnosis not present

## 2021-12-10 DIAGNOSIS — F17210 Nicotine dependence, cigarettes, uncomplicated: Secondary | ICD-10-CM | POA: Insufficient documentation

## 2021-12-10 DIAGNOSIS — I110 Hypertensive heart disease with heart failure: Secondary | ICD-10-CM | POA: Insufficient documentation

## 2021-12-10 DIAGNOSIS — E669 Obesity, unspecified: Secondary | ICD-10-CM | POA: Insufficient documentation

## 2021-12-10 DIAGNOSIS — Z7984 Long term (current) use of oral hypoglycemic drugs: Secondary | ICD-10-CM | POA: Insufficient documentation

## 2021-12-10 DIAGNOSIS — I5022 Chronic systolic (congestive) heart failure: Secondary | ICD-10-CM

## 2021-12-10 DIAGNOSIS — R059 Cough, unspecified: Secondary | ICD-10-CM | POA: Insufficient documentation

## 2021-12-10 DIAGNOSIS — Z6841 Body Mass Index (BMI) 40.0 and over, adult: Secondary | ICD-10-CM | POA: Diagnosis not present

## 2021-12-10 DIAGNOSIS — F319 Bipolar disorder, unspecified: Secondary | ICD-10-CM | POA: Insufficient documentation

## 2021-12-10 DIAGNOSIS — R0609 Other forms of dyspnea: Secondary | ICD-10-CM | POA: Insufficient documentation

## 2021-12-10 DIAGNOSIS — Z7901 Long term (current) use of anticoagulants: Secondary | ICD-10-CM | POA: Insufficient documentation

## 2021-12-10 DIAGNOSIS — E1165 Type 2 diabetes mellitus with hyperglycemia: Secondary | ICD-10-CM | POA: Insufficient documentation

## 2021-12-10 DIAGNOSIS — Z79899 Other long term (current) drug therapy: Secondary | ICD-10-CM | POA: Insufficient documentation

## 2021-12-10 DIAGNOSIS — K761 Chronic passive congestion of liver: Secondary | ICD-10-CM | POA: Diagnosis not present

## 2021-12-10 DIAGNOSIS — I10 Essential (primary) hypertension: Secondary | ICD-10-CM | POA: Diagnosis not present

## 2021-12-10 DIAGNOSIS — Z87898 Personal history of other specified conditions: Secondary | ICD-10-CM

## 2021-12-10 LAB — COMPREHENSIVE METABOLIC PANEL
ALT: 16 U/L (ref 0–44)
AST: 27 U/L (ref 15–41)
Albumin: 3.7 g/dL (ref 3.5–5.0)
Alkaline Phosphatase: 60 U/L (ref 38–126)
Anion gap: 9 (ref 5–15)
BUN: 11 mg/dL (ref 6–20)
CO2: 22 mmol/L (ref 22–32)
Calcium: 9.1 mg/dL (ref 8.9–10.3)
Chloride: 109 mmol/L (ref 98–111)
Creatinine, Ser: 0.95 mg/dL (ref 0.61–1.24)
GFR, Estimated: >60 mL/min (ref >60)
Glucose, Bld: 113 mg/dL — ABNORMAL HIGH (ref 70–99)
Potassium: 4.2 mmol/L (ref 3.5–5.1)
Sodium: 140 mmol/L (ref 135–145)
Total Bilirubin: 1 mg/dL (ref 0.3–1.2)
Total Protein: 7.3 g/dL (ref 6.5–8.1)

## 2021-12-10 LAB — CBC
HCT: 44.8 % (ref 39.0–52.0)
Hemoglobin: 13.2 g/dL (ref 13.0–17.0)
MCH: 20.8 pg — ABNORMAL LOW (ref 26.0–34.0)
MCHC: 29.5 g/dL — ABNORMAL LOW (ref 30.0–36.0)
MCV: 70.7 fL — ABNORMAL LOW (ref 80.0–100.0)
Platelets: 337 10*3/uL (ref 150–400)
RBC: 6.34 MIL/uL — ABNORMAL HIGH (ref 4.22–5.81)
RDW: 18.7 % — ABNORMAL HIGH (ref 11.5–15.5)
WBC: 7.5 10*3/uL (ref 4.0–10.5)
nRBC: 0.4 % — ABNORMAL HIGH (ref 0.0–0.2)

## 2021-12-10 LAB — BRAIN NATRIURETIC PEPTIDE: B Natriuretic Peptide: 691.3 pg/mL — ABNORMAL HIGH (ref 0.0–100.0)

## 2021-12-10 MED ORDER — LOSARTAN POTASSIUM 25 MG PO TABS: 12.5000 mg | ORAL_TABLET | Freq: Every day | ORAL | 3 refills | Status: DC

## 2021-12-10 MED ORDER — SPIRONOLACTONE 25 MG PO TABS: 12.5000 mg | ORAL_TABLET | Freq: Every day | ORAL | 3 refills | Status: AC

## 2021-12-10 NOTE — Progress Notes (Signed)
ITAMAR home sleep study given to patient, all instructions explained, and CLOUDPAT registration complete.  

## 2021-12-10 NOTE — Progress Notes (Signed)
Patient Name: Gilbert Graham        DOB: [birth year]      Height:     Weight:  Office Name:         Referring Provider:  Today's Date:  Date:   STOP BANG RISK ASSESSMENT S (snore) Have you been told that you snore?     YES   T (tired) Are you often tired, fatigued, or sleepy during the day?   YES  O (obstruction) Do you stop breathing, choke, or gasp during sleep? NO   P (pressure) Do you have or are you being treated for high blood pressure? YES   B (BMI) Is your body index greater than 35 kg/m? YES  A (age) Are you 31 years old or older? NO   N (neck) Do you have a neck circumference greater than 16 inches?   YES   G (gender) Are you a male? YES   TOTAL STOP/BANG "YES" ANSWERS                                                                        For Office Use Only              Procedure Order Form    YES to 3+ Stop Bang questions OR two clinical symptoms - patient qualifies for WatchPAT (CPT 95800)             Clinical Notes: Will consult Sleep Specialist and refer for management of therapy due to patient increased risk of Sleep Apnea. Ordering a sleep study due to the following two clinical symptoms: Excessive daytime sleepiness G47.10 / Gastroesophageal reflux K21.9 / Nocturia R35.1 / Morning Headaches G44.221 / Difficulty concentrating R41.840 / Memory problems or poor judgment G31.84 / Personality changes or irritability R45.4 / Loud snoring R06.83 / Depression F32.9 / Unrefreshed by sleep G47.8 / Impotence N52.9 / History of high blood pressure R03.0 / Insomnia G47.00    I understand that I am proceeding with a home sleep apnea test as ordered by my treating physician. I understand that untreated sleep apnea is a serious cardiovascular risk factor and it is my responsibility to perform the test and seek management for sleep apnea. I will be contacted with the results and be managed for sleep apnea by a local sleep physician. I will be receiving equipment and further  instructions from [facility]. I shall promptly ship back the equipment via the included mailing label. I understand my insurance will be billed for the test and as the patient I am responsible for any insurance related out-of-pocket costs incurred. I have been provided with written instructions and can call for additional video or telephonic instruction, with 24-hour availability of qualified personnel to answer any questions: Patient Help Desk [phone].  Patient Signature ______________________________________________________   Date______________________ Patient Telemedicine Verbal Consent

## 2021-12-10 NOTE — Patient Instructions (Signed)
STOP Lisinopril  START Losartan 12.5 mg one half tab nightly at bedtime START Spironolactone 12.5 one half tab daily  Labs today We will only contact you if something comes back abnormal or we need to make some changes. Otherwise no news is good news!  Labs needed in 7-10 days  Your provider has recommended that you have a home sleep study.  This has to be approved by your insurance company. We will schedule you an appointment to pick up the equipment to give [place] time to complete this authorization. Once you have the equipment you will download the app on your phone and follow the instructions. YOUR PIN NUMBER IS: [id number]. Once you have completed the test the information is sent to the company through [employer] and you can dispose of the equipment. If your test is positive you will receive a call from Dr Norris Cross office [facility]) to set up your CPAP equipment.   Your physician recommends that you schedule a follow-up appointment in: 4-5 weeks  in the Advanced Practition[facility] (PA/NP) Clinic   Do the following things EVERYDAY: Weigh yourself in the morning before breakfast. Write it down and keep it in a log. Take your medicines as prescribed Eat low salt foods--Limit salt (sodium) to 2000 mg per day.  Stay as active as you can everyday Limit all fluids for the day to less than 2 liters  At the [facility], you and your health needs are our priority. As part of our continuing mission to provide you with exceptional heart care, we have created designated Provider Care Teams. These Care Teams include your primary Cardiologist (physician) and Advanced Practice Providers (APPs- Physician Assistants and Nurse Practitioners) who all work together to provide you with the care you need, when you need it.   You may see any of the following providers on your designated Care Team at your next follow up: Dr Arvilla Meres Dr Carron Curie, NP Robbie Lis, [place] [facility], [place] Karle Plumber, PharmD   Please be sure to bring in all your medications bottles to every appointment.      You are scheduled for a Cardiac Catheterization on Tuesday, July 25 with Dr. Arvilla Meres.  1. Please arrive at the Main Entrance A at [facility]: [address] [place], [place] [address] at 5:30 AM (This time is two hours before your procedure to ensure your preparation). Free valet parking service is available.   Special note: Every effort is made to have your procedure done on time. Please understand that emergencies sometimes delay scheduled procedures.  2. Diet: Do not eat solid foods after midnight.  You may have clear liquids until 5 AM upon the day of the procedure.  3. Labs: pre procedure labs done 12/10/21  4. Medication instructions in preparation for your procedure:   Contrast Allergy: No    Stop taking, Torsemide (Demadex) Tuesday, July 25,     On the morning of your procedure, take Aspirin and any morning medicines NOT listed above.  You may use sips of water.  5. Plan to go home the same day, you will only stay overnight if medically necessary. 6. You MUST have a responsible adult to drive you home. 7. An adult MUST be with you the first 24 hours after you arrive home. 8. Bring a current list of your medications, and the last time and date medication taken. 9. Bring ID and current insurance cards. 10.Please wear clothes  that are easy to get on and off and wear slip-on shoes.  Thank you for allowing [place] to care for you!   -- [facility]

## 2021-12-10 NOTE — H&P (View-Only) (Signed)
HEART & VASCULAR TRANSITION OF CARE CONSULT NOTE     Referring Physician: Dr. Rennis Golden  Primary Care: Simone Curia, MD Primary Cardiologist: Dr. Rennis Golden (new, seen for 1st time during admission)    HPI: Referred to clinic by Dr. Rennis Golden for heart failure consultation.   31 y/o male w/ obesity, Body mass index is 49.1 kg/m., HTN, poorly controlled DM, chronic systolic heart failure and reported h/o bipolar disorder and PTSD.   Echo in 2018, done at [facility], showed normal LVEF, 55%, mild LVH, normal RV.  Echo 01/2021, done at [facility] in [place], showed severely reduced LVEF, 20%, w/ diffuse HK, and mildly reduced RV systolic function, moderate TR and moderately elevated RVSP 49 mmhg. Cardiac cath was recommended but pt declined. Also declined LifeVest and was lost to f/u.   Recently moved to the [place]. Had been out of his home meds for a while. W/o local PCP.   Presented to [facility] ED 5/31 w/ complaints of RUQ pain and dyspnea. Found to be in a/c CHF, in the setting of hypertensive urgency. LFTs also elevated  AST of 1744 and ALT 1682.  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  Hepatitis panel was negative.  Drug screen positive for benzodiazepines, amphetamines, THC and opiates.   He was diuresed w/ IV Lasix and placed on GDMT/ HTN regimen. Echo done, EF 20-25%, RV normal, though RVSP moderately elevated at 46 mmHg. After diuresis w/ IV Lasix, was transitioned to PO torsemide +  [place] and Coreg. Atorvastatin discontinued given elevated LFTs. D/c wt 299 lb.   Since discharged, he has established care w/ a new PCP in [place]. At visit, BP was noted to be elevated and he was placed on lisinopril 5 mg daily.   He presents to [facility] clinic today for assessment. Here w/ his wife. BP elevated at 136/104, prior to taking any am meds. He reports labile BP at home w/ a few SBP readings in the 80s. He feels tired when BP gets  low but denies dizziness, no syncope/ near syncope. No LEE but notes abdominal fullness/ swelling and early satiety. + occasional dry cough.  No orthopnea/PND. + exertional dyspnea, NYHA Class II. No resting dyspnea. + occasional left sided chest pain. Wife reports h/o snoring.   He reports full med compliance and still smoking, though has cut down to < 1ppd.   He reports + FH of HF. Mother diagnosed w/ CHF in her 1s and has ICD. Father and paternal grandfather both had multiple MIs.   The patient himself recalls having episodes of left sided chest and arm pain around the time he was diagnosed w/ CHF last year. Also recalls being told of troponin elevation in lab work but never completed recommended cardiac cath. Also reports prior hospitalization in 2018 for acute hypoxic respiratory failure requiring intubation and what sounds to be bad respiratory infection/PNA.    Cardiac Testing   Echo 2018 [facility]): EF 55%, mild LVH, RV normal  Echo 2022 ([facility]): EF 20%, diffuse HK, RV mildly reduced, RVSP 49 mmgh, mod TR   2D Echo 6/23 [facility]):  Left ventricular ejection fraction, by estimation, is 20 to 25%. The left ventricle has severely decreased function. The left ventricle demonstrates global hypokinesis. The left ventricular internal cavity size was severely dilated. There is mild concentric left ventricular hypertrophy. Left ventricular diastolic parameters are indeterminate. 1. Right ventricular systolic function is normal. The right ventricular size  is normal. There is moderately elevated pulmonary artery systolic pressure. 2. The mitral valve is normal in structure. No evidence of mitral valve regurgitation. No evidence of mitral stenosis. 3. The aortic valve is normal in structure. Aortic valve regurgitation is not visualized. No aortic stenosis is present. 4. The inferior vena cava is normal in size with greater than 50% respiratory variability, suggesting right atrial pressure of 3  mmHg.     Review of Systems: [y] = yes, [ ]  = no   General: Weight gain [ ] ; Weight loss [ ] ; Anorexia [ Y]; Fatigue [ Y]; Fever [ ] ; Chills [ ] ; Weakness [ ]   Cardiac: Chest pain/pressure [Y ]; Resting SOB [ ] ; Exertional SOB [Y ]; Orthopnea [ ] ; Pedal Edema [ ] ; Palpitations [ ] ; Syncope [ ] ; Presyncope [ ] ; Paroxysmal nocturnal dyspnea[ ]   Pulmonary: Cough [ ] ; Wheezing[ ] ; Hemoptysis[ ] ; Sputum [ ] ; Snoring [Y ]  GI: Vomiting[ ] ; Dysphagia[ ] ; Melena[ ] ; Hematochezia [ ] ; Heartburn[ ] ; Abdominal pain [ ] ; Constipation [ ] ; Diarrhea [ ] ; BRBPR [ ]   GU: Hematuria[ ] ; Dysuria [ ] ; Nocturia[ ]   Vascular: Pain in legs with walking [ ] ; Pain in feet with lying flat [ ] ; Non-healing sores [ ] ; Stroke [ ] ; TIA [ ] ; Slurred speech [ ] ;  Neuro: Headaches[ ] ; Vertigo[ ] ; Seizures[ ] ; Paresthesias[ ] ;Blurred vision [ ] ; Diplopia [ ] ; Vision changes [ ]   Ortho/Skin: Arthritis [ ] ; Joint pain [ ] ; Muscle pain [ ] ; Joint swelling [ ] ; Back Pain [ Y]; Rash [ ]   Psych: Depression[ ] ; Anxiety[ ]   Heme: Bleeding problems [ ] ; Clotting disorders [ ] ; Anemia [ ]   Endocrine: Diabetes [ Y]; Thyroid dysfunction[ ]    Past Medical History:  Diagnosis Date   CHF (congestive heart failure) (HCC)    Diabetes mellitus without complication (HCC)    Scoliosis     Current Outpatient Medications  Medication Sig Dispense Refill   albuterol (VENTOLIN HFA) 108 (90 Base) MCG/ACT inhaler Inhale 2 puffs into the lungs every 6 (six) hours as needed for wheezing or shortness of breath. 1 each 1   atorvastatin (LIPITOR) 40 MG tablet Take 40 mg by mouth daily.     carvedilol (COREG) 3.125 MG tablet Take 3.125 mg by mouth 2 (two) times daily.     clonazePAM (KLONOPIN) 0.5 MG tablet Take 0.5 mg by mouth daily as needed for anxiety.     dapagliflozin propanediol (FARXIGA) 10 MG TABS tablet Take 10 mg by mouth daily.     EQ ASPIRIN ADULT LOW DOSE 81 MG tablet Take 81 mg by mouth daily.     FLUoxetine (PROZAC) 40 MG capsule  Take 40 mg by mouth daily.     lithium carbonate (ESKALITH) 450 MG CR tablet Take 450 mg by mouth at bedtime.     losartan (COZAAR) 25 MG tablet Take 0.5 tablets (12.5 mg total) by mouth at bedtime. 45 tablet 3   omeprazole (PRILOSEC) 20 MG capsule Take 20 mg by mouth daily.     potassium chloride SA (KLOR-CON M) 20 MEQ tablet Take 1 tablet (20 mEq total) by mouth daily. 10 tablet 0   pregabalin (LYRICA) 100 MG capsule Take 100 mg by mouth 2 (two) times daily.     spironolactone (ALDACTONE) 25 MG tablet Take 0.5 tablets (12.5 mg total) by mouth daily. 45 tablet 3   torsemide (DEMADEX) 20 MG tablet Take 1 tablet (20 mg total) by mouth daily. 30 tablet  3   polyethylene glycol (MIRALAX / GLYCOLAX) 17 g packet Take 17 g by mouth 2 (two) times daily. (Patient not taking: Reported on 12/10/2021) 14 each 0   prazosin (MINIPRESS) 5 MG capsule Take 5 mg by mouth at bedtime as needed (sleep). (Patient not taking: Reported on 12/10/2021)     senna-docusate (SENOKOT-S) 8.6-50 MG tablet Take 1 tablet by mouth 2 (two) times daily. (Patient not taking: Reported on 12/10/2021) 30 tablet 0   traZODone (DESYREL) 50 MG tablet Take 50 mg by mouth at bedtime as needed for sleep. (Patient not taking: Reported on 12/10/2021)     No current facility-administered medications for this encounter.    Allergies  Allergen Reactions   Amoxicillin Diarrhea   Imitrex [Sumatriptan] Other (See Comments)    Makes migraine worse      Social History   Socioeconomic History   Marital status: Married    Spouse name: Not on file   Number of children: Not on file   Years of education: Not on file   Highest education level: Not on file  Occupational History   Not on file  Tobacco Use   Smoking status: Every Day    Types: Cigars   Smokeless tobacco: Never  Vaping Use   Vaping Use: Never used  Substance and Sexual Activity   Alcohol use: Never   Drug use: Never   Sexual activity: Not on file  Other Topics Concern   Not  on file  Social History Narrative   Not on file   Social Determinants of Health   Financial Resource Strain: Low Risk  (12/10/2021)   Overall Financial Resource Strain (CARDIA)    Difficulty of Paying Living Expenses: Not very hard  Food Insecurity: No Food Insecurity (12/10/2021)   Hunger Vital Sign    Worried About Running Out of Food in the Last Year: Never true    Ran Out of Food in the Last Year: Never true  Transportation Needs: No Transportation Needs (12/10/2021)   PRAPARE - Administrator, Civil Service (Medical): No    Lack of Transportation (Non-Medical): No  Physical Activity: Not on file  Stress: Not on file  Social Connections: Not on file  Intimate Partner Violence: Not on file      Family History  Problem Relation Age of Onset   Asthma Mother    Heart failure Mother    Fibromyalgia Mother    Diabetes Father    Coronary artery disease Father    Heart disease Paternal Grandfather     Vitals:   12/10/21 1005  BP: (!) 136/104  Pulse: (!) 104  SpO2: 94%  Weight: (!) 138 kg (304 lb 3.2 oz)  Height: 5\' 6"  (1.676 m)    PHYSICAL EXAM: General:  Well appearing, obese. No respiratory difficulty HEENT: normal Neck: supple. JVD 8 cm. Carotids 2+ bilat; no bruits. No lymphadenopathy or thryomegaly appreciated. Cor: PMI nondisplaced. Regular rate & rhythm. No rubs, gallops or murmurs. Lungs: clear Abdomen: obese, soft, nontender, nondistended. No hepatosplenomegaly. No bruits or masses. Good bowel sounds. Extremities: no cyanosis, clubbing, rash, edema Neuro: alert & oriented x 3, cranial nerves grossly intact. moves all 4 extremities w/o difficulty. Affect pleasant.  ECG: NSR, LAE, low voltage, cannot r/o old anterior infarct    ASSESSMENT & PLAN:  Chronic Systolic Heart Failure - Echo [facility]): EF 55%, mild LVH, RV normal  - Echo 2022 ([facility]): EF 20%, diffuse HK, RV mildly reduced, RVSP 49 mmgh,  mod TR  - 2D Echo 6/23 [facility]): EF 20-25%, RV  read as " normal" on echo report, but suspect some underlying RV dysfunction base on recent history of abdominal pain/elevated HLFts and imaging c/w congestive hepatopathy - Etiology uncertain but DD includes ICM/ CAD, vs HTN CM, vs Familial vs ? Viral CM  - He has multiple risk factors for CAD and h/o left sided chest pain  - Plan [facility] w/ Dr. Gala Romney  - cMRI if cath unremarkable  - Continue GDMT titration. NYHA Class II. Mildly fluid overloaded on exam  - Continue Farxiga 10 mg daily  - Stop Lisinopril. Start Losartan 12.5 mg qhs. Eventual Entresto  - Start Spironolactone 12.5 mg daily  - Continue Coreg 3.125 mg bid - Increase Torsemide to 20 mg bid  - CMP + BNP today. BMP in 7 days  - Refer to the [facility]. F/u q2-3 weeks until meds fully optimized, then plan repeat 2D Echo. ICD if EF remains < 35%  - Assign to Dr. Gala Romney   2. HTN - elevated - GDMT per above  - CMP today   3. Suspected OSA - obesity, + HTN and h/o snoring - recommend sleep study, will arrange   4. Type 2DM  - poorly controlled, Hgb A1c 10.4  - on farxiga and statin  - management per PCP   5. Tobacco Abuse - advised to quit  6. Acute Transaminitis - elevated recent admit, in setting of acute CHF - AST of 1744 and ALT 1682 - hepatitis panel negative  - [place] c/w congestive hepatopathy - HF optimization per above - keep off statin for now  - check CMP today   NYHA II GDMT  Diuretic- Torsemide 20 mg bid BB- Coreg 3.125 mg bid  Ace/ARB/ARNI Losartan 12.5 mg qhs MRA Spironolactone 12.5 mg daily  SGLT2i Farxiga 10 mg daily   Plan [facility] w/ Dr. Gala Romney. I have reviewed the risks, indications, and alternatives to cardiac catheterization and possible angioplasty/stenting with the patient. Risks include but are not limited to bleeding, infection, vascular injury, stroke, myocardial infection, arrhythmia, kidney injury, radiation-related injury in the case of prolonged fluoroscopy use, emergency cardiac  surgery, and death. The patient understands the risks of serious complication is low (<1%).   F/u BMP in 1 wk  F/u APP in 2-3 weeks for further med titration.    Referred to HFSW (PCP, Medications, Transportation, ETOH Abuse, Drug Abuse, Insurance, Financial ): Yes  Refer to Pharmacy:  No Refer to Home Health:  No Refer to Advanced Heart Failure Clinic: Yes (assign to Dr. Gala Romney)  Refer to General Cardiology: No  Follow up  in the AHF w/ APP in 2-3 wks

## 2021-12-10 NOTE — Progress Notes (Signed)
Heart and Vascular Care Navigation  12/10/2021  Gilbert Graham Jan 09, 1991 [id number]  Reason for Referral: Patient seen in HF [facility] clinic.  Patient is participating in a Managed Medicaid Plan: No  Engaged with patient face to face for initial visit for Heart and Vascular Care Coordination.                                                                                                   Assessment:  Patient is a 31 yo married male. Patient states he has not been employed recently mostly due to his current health. He has insurance through [employer] and denies any concerns with his coverage.  Patient denies any concerns with SDoH barriers. Patient shared this is a new diagnosis and would benefit from added information.                                  HRT/VAS Care Coordination     Patients Home Cardiology Office Heart Failure Clinic  HF [facility]   Outpatient Care Team Social Worker   Social Worker Name: Lasandra Beech, [place] [phone]   Living arrangements for the past 2 months Mobile Home   Lives with: Spouse   Patient Current Insurance Coverage Commercial Insurance   Patient Has Concern With Paying Medical Bills No   Does Patient Have Prescription Coverage? Yes   Home Assistive Devices/Equipment Scales       Social History:                                                                             SDOH Screenings   Alcohol Screen: Not on file  Depression (VOZ3-6): Not on file  Financial Resource Strain: Low Risk  (12/10/2021)   Overall Financial Resource Strain (CARDIA)    Difficulty of Paying Living Expenses: Not very hard  Food Insecurity: No Food Insecurity (12/10/2021)   Hunger Vital Sign    Worried About Running Out of Food in the Last Year: Never true    Ran Out of Food in the Last Year: Never true  Housing: Low Risk  (12/10/2021)   Housing    Last Housing Risk Score: 0  Physical Activity: Not on file  Social Connections: Not on file  Stress: Not  on file  Tobacco Use: High Risk (12/10/2021)   Patient History    Smoking Tobacco Use: Every Day    Smokeless Tobacco Use: Never    Passive Exposure: Not on file  Transportation Needs: No Transportation Needs (12/10/2021)   PRAPARE - Transportation    Lack of Transportation (Medical): No    Lack of Transportation (Non-Medical): No    SDOH Interventions: Financial Resources:  Financial Strain Interventions: Intervention Not Indicated   Food Insecurity:  Food Insecurity Interventions: Intervention Not Indicated  Housing Insecurity:  Housing Interventions: Intervention Not Indicated  Transportation:   Transportation Interventions: Intervention Not Indicated   Follow-up plan:  Patient provided with HF Booklet, scale and pill box. CSW reviewed booklet and diet information. Patient and wife grateful for the support and resources. Lasandra Beech, LCSW, CCSW-MCS [phone]

## 2021-12-10 NOTE — Progress Notes (Signed)
HEART & VASCULAR TRANSITION OF CARE CONSULT NOTE     Referring Physician: Dr. Rennis Golden  Primary Care: Simone Curia, MD Primary Cardiologist: Dr. Rennis Golden (new, seen for 1st time during admission)    HPI: Referred to clinic by Dr. Rennis Golden for heart failure consultation.   31 y/o male w/ obesity, Body mass index is 49.1 kg/m., HTN, poorly controlled DM, chronic systolic heart failure and reported h/o bipolar disorder and PTSD.   Echo in 2018, done at [facility], showed normal LVEF, 55%, mild LVH, normal RV.  Echo 01/2021, done at [facility] in [place], showed severely reduced LVEF, 20%, w/ diffuse HK, and mildly reduced RV systolic function, moderate TR and moderately elevated RVSP 49 mmhg. Cardiac cath was recommended but pt declined. Also declined LifeVest and was lost to f/u.   Recently moved to the [place]. Had been out of his home meds for a while. W/o local PCP.   Presented to [facility] ED 5/31 w/ complaints of RUQ pain and dyspnea. Found to be in a/c CHF, in the setting of hypertensive urgency. LFTs also elevated  AST of 1744 and ALT 1682.  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  Hepatitis panel was negative.  Drug screen positive for benzodiazepines, amphetamines, THC and opiates.   He was diuresed w/ IV Lasix and placed on GDMT/ HTN regimen. Echo done, EF 20-25%, RV normal, though RVSP moderately elevated at 46 mmHg. After diuresis w/ IV Lasix, was transitioned to PO torsemide +  [place] and Coreg. Atorvastatin discontinued given elevated LFTs. D/c wt 299 lb.   Since discharged, he has established care w/ a new PCP in [place]. At visit, BP was noted to be elevated and he was placed on lisinopril 5 mg daily.   He presents to [facility] clinic today for assessment. Here w/ his wife. BP elevated at 136/104, prior to taking any am meds. He reports labile BP at home w/ a few SBP readings in the 80s. He feels tired when BP gets  low but denies dizziness, no syncope/ near syncope. No LEE but notes abdominal fullness/ swelling and early satiety. + occasional dry cough.  No orthopnea/PND. + exertional dyspnea, NYHA Class II. No resting dyspnea. + occasional left sided chest pain. Wife reports h/o snoring.   He reports full med compliance and still smoking, though has cut down to < 1ppd.   He reports + FH of HF. Mother diagnosed w/ CHF in her 1s and has ICD. Father and paternal grandfather both had multiple MIs.   The patient himself recalls having episodes of left sided chest and arm pain around the time he was diagnosed w/ CHF last year. Also recalls being told of troponin elevation in lab work but never completed recommended cardiac cath. Also reports prior hospitalization in 2018 for acute hypoxic respiratory failure requiring intubation and what sounds to be bad respiratory infection/PNA.    Cardiac Testing   Echo 2018 [facility]): EF 55%, mild LVH, RV normal  Echo 2022 ([facility]): EF 20%, diffuse HK, RV mildly reduced, RVSP 49 mmgh, mod TR   2D Echo 6/23 [facility]):  Left ventricular ejection fraction, by estimation, is 20 to 25%. The left ventricle has severely decreased function. The left ventricle demonstrates global hypokinesis. The left ventricular internal cavity size was severely dilated. There is mild concentric left ventricular hypertrophy. Left ventricular diastolic parameters are indeterminate. 1. Right ventricular systolic function is normal. The right ventricular size  is normal. There is moderately elevated pulmonary artery systolic pressure. 2. The mitral valve is normal in structure. No evidence of mitral valve regurgitation. No evidence of mitral stenosis. 3. The aortic valve is normal in structure. Aortic valve regurgitation is not visualized. No aortic stenosis is present. 4. The inferior vena cava is normal in size with greater than 50% respiratory variability, suggesting right atrial pressure of 3  mmHg.     Review of Systems: [y] = yes, [ ]  = no   General: Weight gain [ ] ; Weight loss [ ] ; Anorexia [ Y]; Fatigue [ Y]; Fever [ ] ; Chills [ ] ; Weakness [ ]   Cardiac: Chest pain/pressure [Y ]; Resting SOB [ ] ; Exertional SOB [Y ]; Orthopnea [ ] ; Pedal Edema [ ] ; Palpitations [ ] ; Syncope [ ] ; Presyncope [ ] ; Paroxysmal nocturnal dyspnea[ ]   Pulmonary: Cough [ ] ; Wheezing[ ] ; Hemoptysis[ ] ; Sputum [ ] ; Snoring [Y ]  GI: Vomiting[ ] ; Dysphagia[ ] ; Melena[ ] ; Hematochezia [ ] ; Heartburn[ ] ; Abdominal pain [ ] ; Constipation [ ] ; Diarrhea [ ] ; BRBPR [ ]   GU: Hematuria[ ] ; Dysuria [ ] ; Nocturia[ ]   Vascular: Pain in legs with walking [ ] ; Pain in feet with lying flat [ ] ; Non-healing sores [ ] ; Stroke [ ] ; TIA [ ] ; Slurred speech [ ] ;  Neuro: Headaches[ ] ; Vertigo[ ] ; Seizures[ ] ; Paresthesias[ ] ;Blurred vision [ ] ; Diplopia [ ] ; Vision changes [ ]   Ortho/Skin: Arthritis [ ] ; Joint pain [ ] ; Muscle pain [ ] ; Joint swelling [ ] ; Back Pain [ Y]; Rash [ ]   Psych: Depression[ ] ; Anxiety[ ]   Heme: Bleeding problems [ ] ; Clotting disorders [ ] ; Anemia [ ]   Endocrine: Diabetes [ Y]; Thyroid dysfunction[ ]    Past Medical History:  Diagnosis Date   CHF (congestive heart failure) (HCC)    Diabetes mellitus without complication (HCC)    Scoliosis     Current Outpatient Medications  Medication Sig Dispense Refill   albuterol (VENTOLIN HFA) 108 (90 Base) MCG/ACT inhaler Inhale 2 puffs into the lungs every 6 (six) hours as needed for wheezing or shortness of breath. 1 each 1   atorvastatin (LIPITOR) 40 MG tablet Take 40 mg by mouth daily.     carvedilol (COREG) 3.125 MG tablet Take 3.125 mg by mouth 2 (two) times daily.     clonazePAM (KLONOPIN) 0.5 MG tablet Take 0.5 mg by mouth daily as needed for anxiety.     dapagliflozin propanediol (FARXIGA) 10 MG TABS tablet Take 10 mg by mouth daily.     EQ ASPIRIN ADULT LOW DOSE 81 MG tablet Take 81 mg by mouth daily.     FLUoxetine (PROZAC) 40 MG capsule  Take 40 mg by mouth daily.     lithium carbonate (ESKALITH) 450 MG CR tablet Take 450 mg by mouth at bedtime.     losartan (COZAAR) 25 MG tablet Take 0.5 tablets (12.5 mg total) by mouth at bedtime. 45 tablet 3   omeprazole (PRILOSEC) 20 MG capsule Take 20 mg by mouth daily.     potassium chloride SA (KLOR-CON M) 20 MEQ tablet Take 1 tablet (20 mEq total) by mouth daily. 10 tablet 0   pregabalin (LYRICA) 100 MG capsule Take 100 mg by mouth 2 (two) times daily.     spironolactone (ALDACTONE) 25 MG tablet Take 0.5 tablets (12.5 mg total) by mouth daily. 45 tablet 3   torsemide (DEMADEX) 20 MG tablet Take 1 tablet (20 mg total) by mouth daily. 30 tablet  3   polyethylene glycol (MIRALAX / GLYCOLAX) 17 g packet Take 17 g by mouth 2 (two) times daily. (Patient not taking: Reported on 12/10/2021) 14 each 0   prazosin (MINIPRESS) 5 MG capsule Take 5 mg by mouth at bedtime as needed (sleep). (Patient not taking: Reported on 12/10/2021)     senna-docusate (SENOKOT-S) 8.6-50 MG tablet Take 1 tablet by mouth 2 (two) times daily. (Patient not taking: Reported on 12/10/2021) 30 tablet 0   traZODone (DESYREL) 50 MG tablet Take 50 mg by mouth at bedtime as needed for sleep. (Patient not taking: Reported on 12/10/2021)     No current facility-administered medications for this encounter.    Allergies  Allergen Reactions   Amoxicillin Diarrhea   Imitrex [Sumatriptan] Other (See Comments)    Makes migraine worse      Social History   Socioeconomic History   Marital status: Married    Spouse name: Not on file   Number of children: Not on file   Years of education: Not on file   Highest education level: Not on file  Occupational History   Not on file  Tobacco Use   Smoking status: Every Day    Types: Cigars   Smokeless tobacco: Never  Vaping Use   Vaping Use: Never used  Substance and Sexual Activity   Alcohol use: Never   Drug use: Never   Sexual activity: Not on file  Other Topics Concern   Not  on file  Social History Narrative   Not on file   Social Determinants of Health   Financial Resource Strain: Low Risk  (12/10/2021)   Overall Financial Resource Strain (CARDIA)    Difficulty of Paying Living Expenses: Not very hard  Food Insecurity: No Food Insecurity (12/10/2021)   Hunger Vital Sign    Worried About Running Out of Food in the Last Year: Never true    Ran Out of Food in the Last Year: Never true  Transportation Needs: No Transportation Needs (12/10/2021)   PRAPARE - Administrator, Civil Service (Medical): No    Lack of Transportation (Non-Medical): No  Physical Activity: Not on file  Stress: Not on file  Social Connections: Not on file  Intimate Partner Violence: Not on file      Family History  Problem Relation Age of Onset   Asthma Mother    Heart failure Mother    Fibromyalgia Mother    Diabetes Father    Coronary artery disease Father    Heart disease Paternal Grandfather     Vitals:   12/10/21 1005  BP: (!) 136/104  Pulse: (!) 104  SpO2: 94%  Weight: (!) 138 kg (304 lb 3.2 oz)  Height: 5\' 6"  (1.676 m)    PHYSICAL EXAM: General:  Well appearing, obese. No respiratory difficulty HEENT: normal Neck: supple. JVD 8 cm. Carotids 2+ bilat; no bruits. No lymphadenopathy or thryomegaly appreciated. Cor: PMI nondisplaced. Regular rate & rhythm. No rubs, gallops or murmurs. Lungs: clear Abdomen: obese, soft, nontender, nondistended. No hepatosplenomegaly. No bruits or masses. Good bowel sounds. Extremities: no cyanosis, clubbing, rash, edema Neuro: alert & oriented x 3, cranial nerves grossly intact. moves all 4 extremities w/o difficulty. Affect pleasant.  ECG: NSR, LAE, low voltage, cannot r/o old anterior infarct    ASSESSMENT & PLAN:  Chronic Systolic Heart Failure - Echo [facility]): EF 55%, mild LVH, RV normal  - Echo 2022 ([facility]): EF 20%, diffuse HK, RV mildly reduced, RVSP 49 mmgh,  mod TR  - 2D Echo 6/23 [facility]): EF 20-25%, RV  read as " normal" on echo report, but suspect some underlying RV dysfunction base on recent history of abdominal pain/elevated HLFts and imaging c/w congestive hepatopathy - Etiology uncertain but DD includes ICM/ CAD, vs HTN CM, vs Familial vs ? Viral CM  - He has multiple risk factors for CAD and h/o left sided chest pain  - Plan [facility] w/ Dr. Gala Romney  - cMRI if cath unremarkable  - Continue GDMT titration. NYHA Class II. Mildly fluid overloaded on exam  - Continue Farxiga 10 mg daily  - Stop Lisinopril. Start Losartan 12.5 mg qhs. Eventual Entresto  - Start Spironolactone 12.5 mg daily  - Continue Coreg 3.125 mg bid - Increase Torsemide to 20 mg bid  - CMP + BNP today. BMP in 7 days  - Refer to the [facility]. F/u q2-3 weeks until meds fully optimized, then plan repeat 2D Echo. ICD if EF remains < 35%  - Assign to Dr. Gala Romney   2. HTN - elevated - GDMT per above  - CMP today   3. Suspected OSA - obesity, + HTN and h/o snoring - recommend sleep study, will arrange   4. Type 2DM  - poorly controlled, Hgb A1c 10.4  - on farxiga and statin  - management per PCP   5. Tobacco Abuse - advised to quit  6. Acute Transaminitis - elevated recent admit, in setting of acute CHF - AST of 1744 and ALT 1682 - hepatitis panel negative  - [place] c/w congestive hepatopathy - HF optimization per above - keep off statin for now  - check CMP today   NYHA II GDMT  Diuretic- Torsemide 20 mg bid BB- Coreg 3.125 mg bid  Ace/ARB/ARNI Losartan 12.5 mg qhs MRA Spironolactone 12.5 mg daily  SGLT2i Farxiga 10 mg daily   Plan [facility] w/ Dr. Gala Romney. I have reviewed the risks, indications, and alternatives to cardiac catheterization and possible angioplasty/stenting with the patient. Risks include but are not limited to bleeding, infection, vascular injury, stroke, myocardial infection, arrhythmia, kidney injury, radiation-related injury in the case of prolonged fluoroscopy use, emergency cardiac  surgery, and death. The patient understands the risks of serious complication is low (<1%).   F/u BMP in 1 wk  F/u APP in 2-3 weeks for further med titration.    Referred to HFSW (PCP, Medications, Transportation, ETOH Abuse, Drug Abuse, Insurance, Financial ): Yes  Refer to Pharmacy:  No Refer to Home Health:  No Refer to Advanced Heart Failure Clinic: Yes (assign to Dr. Gala Romney)  Refer to General Cardiology: No  Follow up  in the AHF w/ APP in 2-3 wks

## 2021-12-12 ENCOUNTER — Telehealth (HOSPITAL_COMMUNITY): Payer: Self-pay | Admitting: Cardiology

## 2021-12-12 DIAGNOSIS — E1169 Type 2 diabetes mellitus with other specified complication: Secondary | ICD-10-CM | POA: Diagnosis not present

## 2021-12-12 DIAGNOSIS — R809 Proteinuria, unspecified: Secondary | ICD-10-CM | POA: Diagnosis not present

## 2021-12-12 DIAGNOSIS — G8929 Other chronic pain: Secondary | ICD-10-CM | POA: Diagnosis not present

## 2021-12-12 DIAGNOSIS — Z6841 Body Mass Index (BMI) 40.0 and over, adult: Secondary | ICD-10-CM | POA: Diagnosis not present

## 2021-12-12 DIAGNOSIS — I10 Essential (primary) hypertension: Secondary | ICD-10-CM | POA: Diagnosis not present

## 2021-12-12 DIAGNOSIS — M25551 Pain in right hip: Secondary | ICD-10-CM | POA: Diagnosis not present

## 2021-12-12 DIAGNOSIS — E785 Hyperlipidemia, unspecified: Secondary | ICD-10-CM | POA: Diagnosis not present

## 2021-12-12 DIAGNOSIS — I509 Heart failure, unspecified: Secondary | ICD-10-CM | POA: Diagnosis not present

## 2021-12-12 DIAGNOSIS — K273 Acute peptic ulcer, site unspecified, without hemorrhage or perforation: Secondary | ICD-10-CM | POA: Diagnosis not present

## 2021-12-12 DIAGNOSIS — J45909 Unspecified asthma, uncomplicated: Secondary | ICD-10-CM | POA: Diagnosis not present

## 2021-12-12 DIAGNOSIS — E669 Obesity, unspecified: Secondary | ICD-10-CM | POA: Diagnosis not present

## 2021-12-12 DIAGNOSIS — M549 Dorsalgia, unspecified: Secondary | ICD-10-CM | POA: Diagnosis not present

## 2021-12-12 MED ORDER — TORSEMIDE 20 MG PO TABS: 20.0000 mg | ORAL_TABLET | Freq: Two times a day (BID) | ORAL | 3 refills | Status: DC

## 2021-12-12 NOTE — Telephone Encounter (Signed)
-----   Message from Allayne Butcher, [place] sent at 12/10/2021  2:22 PM EDT ----- Liver function back to normal. Kidney function and K normal. Fluid marker is elevated. In addition to other med changes made today, increase torsemide to 20 mg bid.   Repeat BMP and BNP in 1 wk

## 2021-12-12 NOTE — Telephone Encounter (Signed)
Patient called.  Patient aware.Phone:  [phone] (H)  -pt request to have labs repeated at upcoming cath procedure

## 2021-12-12 NOTE — Addendum Note (Signed)
Encounter addended by: Noralee Space, RN on: 12/12/2021 5:00 PM  Actions taken: Care Plan modified, Order list changed, Diagnosis association updated

## 2021-12-16 ENCOUNTER — Telehealth (HOSPITAL_COMMUNITY): Payer: Self-pay

## 2021-12-16 MED ORDER — TORSEMIDE 20 MG PO TABS: 20.0000 mg | ORAL_TABLET | Freq: Two times a day (BID) | ORAL | 3 refills | Status: DC

## 2021-12-16 NOTE — Telephone Encounter (Addendum)
Pt aware, agreeable, and verbalized understanding  Rx sent to pharmacy   ----- Message from Allayne Butcher, PA-C sent at 12/10/2021  2:22 PM EDT ----- Liver function back to normal. Kidney function and K normal. Fluid marker is elevated. In addition to other med changes made today, increase torsemide to 20 mg bid.   Repeat BMP and BNP in 1 wk

## 2021-12-18 ENCOUNTER — Telehealth (HOSPITAL_COMMUNITY): Payer: Self-pay | Admitting: *Deleted

## 2021-12-18 NOTE — Telephone Encounter (Signed)
R/L heart cath auth faxed to [employer]

## 2021-12-23 ENCOUNTER — Encounter (HOSPITAL_COMMUNITY): Payer: Self-pay | Admitting: Internal Medicine

## 2021-12-23 ENCOUNTER — Encounter (HOSPITAL_COMMUNITY)
Admission: RE | Disposition: A | Discharge status: Home / Self Care | Payer: Self-pay | Source: Ambulatory Visit | Attending: Internal Medicine

## 2021-12-23 ENCOUNTER — Ambulatory Visit (HOSPITAL_COMMUNITY)
Admission: RE | Admit: 2021-12-23 | Discharge: 2021-12-23 | Disposition: A | Discharge status: Home / Self Care | Payer: 59 | Source: Ambulatory Visit | Attending: Internal Medicine | Admitting: Internal Medicine

## 2021-12-23 ENCOUNTER — Other Ambulatory Visit: Payer: Self-pay

## 2021-12-23 DIAGNOSIS — F4310 Post-traumatic stress disorder, unspecified: Secondary | ICD-10-CM | POA: Diagnosis not present

## 2021-12-23 DIAGNOSIS — F17210 Nicotine dependence, cigarettes, uncomplicated: Secondary | ICD-10-CM | POA: Diagnosis not present

## 2021-12-23 DIAGNOSIS — E669 Obesity, unspecified: Secondary | ICD-10-CM | POA: Insufficient documentation

## 2021-12-23 DIAGNOSIS — Z6841 Body Mass Index (BMI) 40.0 and over, adult: Secondary | ICD-10-CM | POA: Diagnosis not present

## 2021-12-23 DIAGNOSIS — F319 Bipolar disorder, unspecified: Secondary | ICD-10-CM | POA: Diagnosis not present

## 2021-12-23 DIAGNOSIS — I428 Other cardiomyopathies: Secondary | ICD-10-CM | POA: Diagnosis not present

## 2021-12-23 DIAGNOSIS — I110 Hypertensive heart disease with heart failure: Secondary | ICD-10-CM | POA: Diagnosis present

## 2021-12-23 DIAGNOSIS — I5022 Chronic systolic (congestive) heart failure: Secondary | ICD-10-CM | POA: Diagnosis not present

## 2021-12-23 DIAGNOSIS — E1165 Type 2 diabetes mellitus with hyperglycemia: Secondary | ICD-10-CM | POA: Insufficient documentation

## 2021-12-23 DIAGNOSIS — I2510 Atherosclerotic heart disease of native coronary artery without angina pectoris: Secondary | ICD-10-CM | POA: Diagnosis not present

## 2021-12-23 HISTORY — PX: RIGHT/LEFT HEART CATH AND CORONARY ANGIOGRAPHY: CATH118266

## 2021-12-23 LAB — POCT I-STAT EG7
Acid-base deficit: 1 mmol/L (ref 0.0–2.0)
Acid-base deficit: 2 mmol/L (ref 0.0–2.0)
Bicarbonate: 23.5 mmol/L (ref 20.0–28.0)
Bicarbonate: 25.6 mmol/L (ref 20.0–28.0)
Calcium, Ion: 1.02 mmol/L — ABNORMAL LOW (ref 1.15–1.40)
Calcium, Ion: 1.21 mmol/L (ref 1.15–1.40)
HCT: 42 % (ref 39.0–52.0)
HCT: 45 % (ref 39.0–52.0)
Hemoglobin: 14.3 g/dL (ref 13.0–17.0)
Hemoglobin: 15.3 g/dL (ref 13.0–17.0)
O2 Saturation: 58 %
O2 Saturation: 61 %
Potassium: 3.1 mmol/L — ABNORMAL LOW (ref 3.5–5.1)
Potassium: 3.7 mmol/L (ref 3.5–5.1)
Sodium: 143 mmol/L (ref 135–145)
Sodium: 147 mmol/L — ABNORMAL HIGH (ref 135–145)
TCO2: 25 mmol/L (ref 22–32)
TCO2: 27 mmol/L (ref 22–32)
pCO2, Ven: 43.2 mmHg — ABNORMAL LOW (ref 44–60)
pCO2, Ven: 47.1 mmHg (ref 44–60)
pH, Ven: 7.343 (ref 7.25–7.43)
pH, Ven: 7.344 (ref 7.25–7.43)
pO2, Ven: 32 mmHg (ref 32–45)
pO2, Ven: 34 mmHg (ref 32–45)

## 2021-12-23 LAB — POCT I-STAT 7, (LYTES, BLD GAS, ICA,H+H)
Acid-base deficit: 1 mmol/L (ref 0.0–2.0)
Bicarbonate: 24 mmol/L (ref 20.0–28.0)
Calcium, Ion: 1.23 mmol/L (ref 1.15–1.40)
HCT: 45 % (ref 39.0–52.0)
Hemoglobin: 15.3 g/dL (ref 13.0–17.0)
O2 Saturation: 96 %
Potassium: 3.7 mmol/L (ref 3.5–5.1)
Sodium: 142 mmol/L (ref 135–145)
TCO2: 25 mmol/L (ref 22–32)
pCO2 arterial: 40.4 mmHg (ref 32–48)
pH, Arterial: 7.383 (ref 7.35–7.45)
pO2, Arterial: 85 mmHg (ref 83–108)

## 2021-12-23 SURGERY — RIGHT/LEFT HEART CATH AND CORONARY ANGIOGRAPHY
Anesthesia: LOCAL

## 2021-12-23 MED ORDER — SODIUM CHLORIDE 0.9 % IV SOLN: 250.0000 mL | INTRAVENOUS | Status: DC | PRN

## 2021-12-23 MED ORDER — SODIUM CHLORIDE 0.9% FLUSH: 3.0000 mL | Freq: Two times a day (BID) | INTRAVENOUS | Status: DC

## 2021-12-23 MED ORDER — POTASSIUM CHLORIDE CRYS ER 20 MEQ PO TBCR
EXTENDED_RELEASE_TABLET | ORAL | Status: AC
  Filled 2021-12-23: qty 1

## 2021-12-23 MED ORDER — FENTANYL CITRATE (PF) 100 MCG/2ML IJ SOLN
INTRAMUSCULAR | Status: AC
  Filled 2021-12-23: qty 2

## 2021-12-23 MED ORDER — TORSEMIDE 20 MG PO TABS
40.0000 mg | ORAL_TABLET | Freq: Two times a day (BID) | ORAL | 3 refills | Status: AC
Start: 2021-12-23 — End: ?

## 2021-12-23 MED ORDER — POTASSIUM CHLORIDE 20 MEQ PO PACK
PACK | ORAL | Status: DC | PRN
  Administered 2021-12-23: 40 meq via ORAL

## 2021-12-23 MED ORDER — SODIUM CHLORIDE 0.9% FLUSH: 3.0000 mL | INTRAVENOUS | Status: DC | PRN

## 2021-12-23 MED ORDER — HEPARIN (PORCINE) IN NACL 1000-0.9 UT/500ML-% IV SOLN
INTRAVENOUS | Status: DC | PRN
  Administered 2021-12-23 (×2): 500 mL

## 2021-12-23 MED ORDER — MIDAZOLAM HCL 2 MG/2ML IJ SOLN
INTRAMUSCULAR | Status: AC
  Filled 2021-12-23: qty 2

## 2021-12-23 MED ORDER — HEPARIN SODIUM (PORCINE) 1000 UNIT/ML IJ SOLN
INTRAMUSCULAR | Status: DC | PRN
  Administered 2021-12-23: 6000 [IU] via INTRAVENOUS

## 2021-12-23 MED ORDER — FENTANYL CITRATE (PF) 100 MCG/2ML IJ SOLN
INTRAMUSCULAR | Status: DC | PRN
  Administered 2021-12-23: 25 ug via INTRAVENOUS

## 2021-12-23 MED ORDER — FUROSEMIDE 10 MG/ML IJ SOLN
INTRAMUSCULAR | Status: AC
  Filled 2021-12-23: qty 4

## 2021-12-23 MED ORDER — HYDRALAZINE HCL 20 MG/ML IJ SOLN: 10.0000 mg | INTRAMUSCULAR | Status: DC | PRN

## 2021-12-23 MED ORDER — IOHEXOL 350 MG/ML SOLN
INTRAVENOUS | Status: DC | PRN
  Administered 2021-12-23: 45 mL

## 2021-12-23 MED ORDER — HEPARIN SODIUM (PORCINE) 1000 UNIT/ML IJ SOLN
INTRAMUSCULAR | Status: AC
  Filled 2021-12-23: qty 10

## 2021-12-23 MED ORDER — SODIUM CHLORIDE 0.9% FLUSH
3.0000 mL | Freq: Two times a day (BID) | INTRAVENOUS | Status: DC
Start: 2021-12-23 — End: 2021-12-23

## 2021-12-23 MED ORDER — ASPIRIN 81 MG PO CHEW: 81.0000 mg | CHEWABLE_TABLET | ORAL | Status: DC

## 2021-12-23 MED ORDER — ACETAMINOPHEN 325 MG PO TABS: 650.0000 mg | ORAL_TABLET | ORAL | Status: DC | PRN

## 2021-12-23 MED ORDER — VERAPAMIL HCL 2.5 MG/ML IV SOLN
INTRAVENOUS | Status: DC | PRN
  Administered 2021-12-23: 10 mL via INTRA_ARTERIAL

## 2021-12-23 MED ORDER — ASPIRIN 81 MG PO CHEW
CHEWABLE_TABLET | ORAL | Status: AC
  Filled 2021-12-23: qty 1

## 2021-12-23 MED ORDER — FUROSEMIDE 10 MG/ML IJ SOLN
INTRAMUSCULAR | Status: DC | PRN
  Administered 2021-12-23: 80 mg via INTRAVENOUS

## 2021-12-23 MED ORDER — LIDOCAINE HCL (PF) 1 % IJ SOLN
INTRAMUSCULAR | Status: DC | PRN
  Administered 2021-12-23 (×2): 2 mL

## 2021-12-23 MED ORDER — LABETALOL HCL 5 MG/ML IV SOLN: 10.0000 mg | INTRAVENOUS | Status: DC | PRN

## 2021-12-23 MED ORDER — VERAPAMIL HCL 2.5 MG/ML IV SOLN
INTRAVENOUS | Status: AC
  Filled 2021-12-23: qty 2

## 2021-12-23 MED ORDER — HEPARIN (PORCINE) IN NACL 1000-0.9 UT/500ML-% IV SOLN
INTRAVENOUS | Status: AC
  Filled 2021-12-23: qty 1000

## 2021-12-23 MED ORDER — ALUM & MAG HYDROXIDE-SIMETH 200-200-20 MG/5ML PO SUSP
30.0000 mL | Freq: Once | ORAL | Status: AC
  Administered 2021-12-23: 30 mL via ORAL
  Filled 2021-12-23: qty 30

## 2021-12-23 MED ORDER — SODIUM CHLORIDE 0.9 % IV SOLN: INTRAVENOUS | Status: DC

## 2021-12-23 MED ORDER — MIDAZOLAM HCL 2 MG/2ML IJ SOLN
INTRAMUSCULAR | Status: DC | PRN
  Administered 2021-12-23: 2 mg via INTRAVENOUS

## 2021-12-23 MED ORDER — LIDOCAINE HCL (PF) 1 % IJ SOLN
INTRAMUSCULAR | Status: AC
  Filled 2021-12-23: qty 30

## 2021-12-23 SURGICAL SUPPLY — 10 items
BAND ZEPHYR COMPRESS 30 LONG (HEMOSTASIS) ×1 IMPLANT
CATH 5FR JL3.5 JR4 ANG PIG MP (CATHETERS) ×1 IMPLANT
CATH BALLN WEDGE 5F 110CM (CATHETERS) ×1 IMPLANT
GLIDESHEATH SLEND SS 6F .021 (SHEATH) ×1 IMPLANT
GUIDEWIRE INQWIRE 1.5J.035X260 (WIRE) IMPLANT
INQWIRE 1.5J .035X260CM (WIRE) ×2
KIT HEART LEFT (KITS) ×1 IMPLANT
PACK CARDIAC CATHETERIZATION (CUSTOM PROCEDURE TRAY) ×2 IMPLANT
SHEATH GLIDE SLENDER 4/5FR (SHEATH) ×1 IMPLANT
TRANSDUCER W/STOPCOCK (MISCELLANEOUS) ×2 IMPLANT

## 2021-12-23 NOTE — Interval H&P Note (Signed)
History and Physical Interval Note:  12/23/2021 7:35 AM  Gilbert Graham  has presented today for surgery, with the diagnosis of heart failure.  The various methods of treatment have been discussed with the patient and family. After consideration of risks, benefits and other options for treatment, the patient has consented to  Procedure(s): RIGHT/LEFT HEART CATH AND CORONARY ANGIOGRAPHY (N/A) and possible coronary angioplasty as a surgical intervention.  The patient's history has been reviewed, patient examined, no change in status, stable for surgery.  I have reviewed the patient's chart and labs.  Questions were answered to the patient's satisfaction.     Muhannad Bignell

## 2021-12-25 DIAGNOSIS — K921 Melena: Secondary | ICD-10-CM | POA: Diagnosis not present

## 2021-12-25 DIAGNOSIS — R110 Nausea: Secondary | ICD-10-CM | POA: Diagnosis not present

## 2021-12-25 DIAGNOSIS — R1010 Upper abdominal pain, unspecified: Secondary | ICD-10-CM | POA: Diagnosis not present

## 2022-01-08 DIAGNOSIS — Z8719 Personal history of other diseases of the digestive system: Secondary | ICD-10-CM | POA: Diagnosis not present

## 2022-01-08 DIAGNOSIS — R1013 Epigastric pain: Secondary | ICD-10-CM | POA: Diagnosis not present

## 2022-01-09 DIAGNOSIS — G8929 Other chronic pain: Secondary | ICD-10-CM | POA: Diagnosis not present

## 2022-01-09 DIAGNOSIS — M159 Polyosteoarthritis, unspecified: Secondary | ICD-10-CM | POA: Diagnosis not present

## 2022-01-09 DIAGNOSIS — E785 Hyperlipidemia, unspecified: Secondary | ICD-10-CM | POA: Diagnosis not present

## 2022-01-09 DIAGNOSIS — M549 Dorsalgia, unspecified: Secondary | ICD-10-CM | POA: Diagnosis not present

## 2022-01-09 DIAGNOSIS — Z87891 Personal history of nicotine dependence: Secondary | ICD-10-CM | POA: Diagnosis not present

## 2022-01-09 DIAGNOSIS — R809 Proteinuria, unspecified: Secondary | ICD-10-CM | POA: Diagnosis not present

## 2022-01-09 DIAGNOSIS — I10 Essential (primary) hypertension: Secondary | ICD-10-CM | POA: Diagnosis not present

## 2022-01-09 DIAGNOSIS — J45909 Unspecified asthma, uncomplicated: Secondary | ICD-10-CM | POA: Diagnosis not present

## 2022-01-09 DIAGNOSIS — I509 Heart failure, unspecified: Secondary | ICD-10-CM | POA: Diagnosis not present

## 2022-01-09 DIAGNOSIS — K273 Acute peptic ulcer, site unspecified, without hemorrhage or perforation: Secondary | ICD-10-CM | POA: Diagnosis not present

## 2022-01-09 DIAGNOSIS — F324 Major depressive disorder, single episode, in partial remission: Secondary | ICD-10-CM | POA: Diagnosis not present

## 2022-01-09 DIAGNOSIS — E1169 Type 2 diabetes mellitus with other specified complication: Secondary | ICD-10-CM | POA: Diagnosis not present

## 2022-01-14 ENCOUNTER — Encounter (HOSPITAL_COMMUNITY): Payer: 59

## 2022-01-27 DIAGNOSIS — E1169 Type 2 diabetes mellitus with other specified complication: Secondary | ICD-10-CM | POA: Diagnosis not present

## 2022-01-27 DIAGNOSIS — G8929 Other chronic pain: Secondary | ICD-10-CM | POA: Diagnosis not present

## 2022-01-27 DIAGNOSIS — R69 Illness, unspecified: Secondary | ICD-10-CM | POA: Diagnosis not present

## 2022-01-27 DIAGNOSIS — E1140 Type 2 diabetes mellitus with diabetic neuropathy, unspecified: Secondary | ICD-10-CM | POA: Diagnosis not present

## 2022-01-27 DIAGNOSIS — E785 Hyperlipidemia, unspecified: Secondary | ICD-10-CM | POA: Diagnosis not present

## 2022-01-27 DIAGNOSIS — I509 Heart failure, unspecified: Secondary | ICD-10-CM | POA: Diagnosis not present

## 2022-01-27 DIAGNOSIS — M549 Dorsalgia, unspecified: Secondary | ICD-10-CM | POA: Diagnosis not present

## 2022-01-27 DIAGNOSIS — J45909 Unspecified asthma, uncomplicated: Secondary | ICD-10-CM | POA: Diagnosis not present

## 2022-01-27 DIAGNOSIS — E669 Obesity, unspecified: Secondary | ICD-10-CM | POA: Diagnosis not present

## 2022-01-27 DIAGNOSIS — R5382 Chronic fatigue, unspecified: Secondary | ICD-10-CM | POA: Diagnosis not present

## 2022-01-27 DIAGNOSIS — I10 Essential (primary) hypertension: Secondary | ICD-10-CM | POA: Diagnosis not present

## 2022-01-27 DIAGNOSIS — M25551 Pain in right hip: Secondary | ICD-10-CM | POA: Diagnosis not present

## 2022-02-11 ENCOUNTER — Encounter (HOSPITAL_COMMUNITY): Payer: Self-pay

## 2022-02-11 ENCOUNTER — Other Ambulatory Visit (HOSPITAL_COMMUNITY): Payer: Self-pay

## 2022-02-11 ENCOUNTER — Ambulatory Visit (HOSPITAL_COMMUNITY)
Admission: RE | Admit: 2022-02-11 | Discharge: 2022-02-11 | Disposition: A | Discharge status: Home / Self Care | Payer: 59 | Source: Ambulatory Visit | Attending: Family Medicine | Admitting: Family Medicine

## 2022-02-11 VITALS — BP 128/100 | HR 81 | Ht 67.0 in | Wt 296.8 lb

## 2022-02-11 DIAGNOSIS — R29818 Other symptoms and signs involving the nervous system: Secondary | ICD-10-CM | POA: Diagnosis not present

## 2022-02-11 DIAGNOSIS — E669 Obesity, unspecified: Secondary | ICD-10-CM | POA: Insufficient documentation

## 2022-02-11 DIAGNOSIS — F4310 Post-traumatic stress disorder, unspecified: Secondary | ICD-10-CM | POA: Insufficient documentation

## 2022-02-11 DIAGNOSIS — E119 Type 2 diabetes mellitus without complications: Secondary | ICD-10-CM

## 2022-02-11 DIAGNOSIS — I110 Hypertensive heart disease with heart failure: Secondary | ICD-10-CM | POA: Diagnosis not present

## 2022-02-11 DIAGNOSIS — Z720 Tobacco use: Secondary | ICD-10-CM

## 2022-02-11 DIAGNOSIS — E1165 Type 2 diabetes mellitus with hyperglycemia: Secondary | ICD-10-CM | POA: Diagnosis not present

## 2022-02-11 DIAGNOSIS — I5022 Chronic systolic (congestive) heart failure: Secondary | ICD-10-CM | POA: Diagnosis not present

## 2022-02-11 DIAGNOSIS — R7989 Other specified abnormal findings of blood chemistry: Secondary | ICD-10-CM | POA: Insufficient documentation

## 2022-02-11 DIAGNOSIS — Z7901 Long term (current) use of anticoagulants: Secondary | ICD-10-CM | POA: Insufficient documentation

## 2022-02-11 DIAGNOSIS — F17290 Nicotine dependence, other tobacco product, uncomplicated: Secondary | ICD-10-CM | POA: Diagnosis not present

## 2022-02-11 DIAGNOSIS — Z7984 Long term (current) use of oral hypoglycemic drugs: Secondary | ICD-10-CM | POA: Diagnosis not present

## 2022-02-11 DIAGNOSIS — Z139 Encounter for screening, unspecified: Secondary | ICD-10-CM | POA: Diagnosis not present

## 2022-02-11 DIAGNOSIS — I2510 Atherosclerotic heart disease of native coronary artery without angina pectoris: Secondary | ICD-10-CM | POA: Insufficient documentation

## 2022-02-11 DIAGNOSIS — Z6841 Body Mass Index (BMI) 40.0 and over, adult: Secondary | ICD-10-CM | POA: Diagnosis not present

## 2022-02-11 DIAGNOSIS — F319 Bipolar disorder, unspecified: Secondary | ICD-10-CM | POA: Insufficient documentation

## 2022-02-11 DIAGNOSIS — K761 Chronic passive congestion of liver: Secondary | ICD-10-CM | POA: Insufficient documentation

## 2022-02-11 DIAGNOSIS — R69 Illness, unspecified: Secondary | ICD-10-CM | POA: Diagnosis not present

## 2022-02-11 DIAGNOSIS — I10 Essential (primary) hypertension: Secondary | ICD-10-CM | POA: Diagnosis not present

## 2022-02-11 DIAGNOSIS — Z79899 Other long term (current) drug therapy: Secondary | ICD-10-CM | POA: Diagnosis not present

## 2022-02-11 DIAGNOSIS — R0600 Dyspnea, unspecified: Secondary | ICD-10-CM | POA: Insufficient documentation

## 2022-02-11 DIAGNOSIS — I428 Other cardiomyopathies: Secondary | ICD-10-CM | POA: Diagnosis not present

## 2022-02-11 LAB — CBC
HCT: 47.8 % (ref 39.0–52.0)
Hemoglobin: 14.2 g/dL (ref 13.0–17.0)
MCH: 20.4 pg — ABNORMAL LOW (ref 26.0–34.0)
MCHC: 29.7 g/dL — ABNORMAL LOW (ref 30.0–36.0)
MCV: 68.8 fL — ABNORMAL LOW (ref 80.0–100.0)
Platelets: 249 10*3/uL (ref 150–400)
RBC: 6.95 MIL/uL — ABNORMAL HIGH (ref 4.22–5.81)
RDW: 20.5 % — ABNORMAL HIGH (ref 11.5–15.5)
WBC: 9.4 10*3/uL (ref 4.0–10.5)
nRBC: 0 % (ref 0.0–0.2)

## 2022-02-11 LAB — BRAIN NATRIURETIC PEPTIDE: B Natriuretic Peptide: 158 pg/mL — ABNORMAL HIGH (ref 0.0–100.0)

## 2022-02-11 MED ORDER — ENTRESTO 24-26 MG PO TABS: 1.0000 | ORAL_TABLET | Freq: Two times a day (BID) | ORAL | 11 refills | Status: AC

## 2022-02-11 MED ORDER — ATORVASTATIN CALCIUM 20 MG PO TABS: 20.0000 mg | ORAL_TABLET | Freq: Every day | ORAL | 3 refills | Status: AC

## 2022-02-11 NOTE — Progress Notes (Signed)
ADVANCED HF CLINIC CONSULT NOTE   Primary Care: Simone Curia, MD Psychiatrist: Dr. Jackquline Berlin HF Cardiologist: Dr. Gala Romney  HPI: Gilbert Graham is a 31 y.o. w/ obesity, HTN, poorly controlled DM, chronic systolic heart failure and reported h/o bipolar disorder and PTSD.    Echo in 2018, done at [facility], showed normal LVEF, 55%, mild LVH, normal RV.   Echo 9/22, [facility] in [place], showed severely reduced EF, 20%, w/ diffuse HK, and mildly reduced RV systolic function, moderate TR and moderately elevated RVSP 49 mmhg. Cardiac cath was recommended but pt declined. Also declined LifeVest and was lost to f/u.    Recently moved to the [place]. Had been out of his home meds for a while. W/o local PCP.    Seen in [facility] 5/23 w/ RUQ pain and dyspnea. Found to be in a/c CHF, in the setting of hypertensive urgency. LFTs elevated  AST of 1744 and ALT 1682.  GI work-up suggestive of possible heart failure related congestive hepatopathy, hepatitis panel negative. UDS +  for benzodiazepines, amphetamines, THC and opiates. Diuresed with IV lasix. Echo showed EF 20-25%, RV normal, RVSP moderately elevated at 46 mmHg. GDMT titrated. Atorvastatin discontinued given elevated LFTs. Discharged home, weight 299 lb.    Seen in [facility] 8/23, NYHA II, continued with episodes of left chest and arm pain. Arranged for [facility] to quantify etiology of HF and evaluate for CAD.   R/LHC (8/23) showed minimal CAD, severe NICM with EF 20-25%, and elevated filling pressures with low CO.  Today he returns for HF follow up with his wife, referred from [facility]. Overall feeling fine. He is not SOB with ADLs, has mild dyspnea walking up inclines. Denies palpitations, CP, dizziness, edema, or PND/Orthopnea. Appetite ok. No fever or chills. Weight at home 288 pounds but not weighing regularly. Taking all medications. Smoking 5 cigs/day, no ETOH/drugs. Psych meds managed by Dr. Jackquline Berlin.  Cardiac Testing    - R/LHC (8/23):    Prox  RCA lesion is 20% stenosed.   The left ventricular ejection fraction is less than 25% by visual estimate.   Ao = 112/83 (98) LV = 120/38 RA = 14 RV = 60/15 PA = 60/32 (43) PCW = 30 Fick cardiac output/index = 4.8/2.1 PVR = 2.7 wu Ao sat = 96% PA sat = 58%, 60% Assessment 1. Minimal CAD 2. Severe NICM EF 20-25% 3. Elevated filling pressures with low cardiac output   - Echo (6/23 at [facility]): EF 20-25%, severe LV dysfunction with global HK, mild LVH, RV ok  - Echo 2022 ([facility]): EF 20%, diffuse HK, RV mildly reduced, RVSP 49 mmgh, mod TR   - Echo 2018 [facility]): EF 55%, mild LVH, RV normal   Review of Systems: [y] = yes, [ ]  = no   General: Weight gain ]; Weight loss [ ] ; Anorexia [ ] ; Fatigue [ ] ; Fever [ ] ; Chills [ ] ; Weakness [ ]   Cardiac: Chest pain/pressure [ ] ; Resting SOB [ ] ; Exertional SOB [ ] ; Orthopnea [ ] ; Pedal [place] ]; Palpitations [ ] ; Syncope [ ] ; Presyncope [ ] ; Paroxysmal nocturnal dyspnea[ ]   Pulmonary: Cough [ ] ; Wheezing[ ] ; Hemoptysis[ ] ; Sputum [ ] ; Snoring ]  GI: Vomiting[ ] ; Dysphagia[ ] ; Melena[ ] ; Hematochezia [ ] ; Heartburn[ ] ; Abdominal pain [ ] ; Constipation [ ] ; Diarrhea [ ] ; BRBPR [ ]   GU: Hematuria[ ] ; Dysuria [ ] ; Nocturia[ ]   Vascular: Pain in legs with walking [ ] ; Pain in feet  with lying flat [ ] ; Non-healing sores [ ] ; Stroke [ ] ; TIA [ ] ; Slurred speech [ ] ;  Neuro: Headaches[ ] ; Vertigo[ ] ; Seizures[ ] ; Paresthesias[ ] ;Blurred vision [ ] ; Diplopia [ ] ; Vision changes [ ]   Ortho/Skin: Arthritis [ ] ; Joint pain [ ] ; Muscle pain [ ] ; Joint swelling [ ] ; Back Pain [ ] ; Rash [ ]   Psych: Depression[y ]; Anxiety[ y]  Heme: Bleeding problems [ ] ; Clotting disorders [ ] ; Anemia [ ]   Endocrine: Diabetes ]; Thyroid dysfunction[ ]   Past Medical History:  Diagnosis Date   CHF (congestive heart failure) (HCC)    Diabetes mellitus without complication (HCC)    Scoliosis    Current Outpatient Medications  Medication Sig Dispense Refill    albuterol (VENTOLIN HFA) 108 (90 Base) MCG/ACT inhaler Inhale 2 puffs into the lungs every 6 (six) hours as needed for wheezing or shortness of breath. 1 each 1   alum & mag hydroxide-simeth (MAALOX/MYLANTA) 200-200-20 MG/5ML suspension Take 30 mLs by mouth every 6 (six) hours as needed for indigestion or heartburn.     carvedilol (COREG) 3.125 MG tablet Take 3.125 mg by mouth 2 (two) times daily.     clonazePAM (KLONOPIN) 0.5 MG tablet Take 0.5 mg by mouth daily as needed for anxiety.     dapagliflozin propanediol (FARXIGA) 10 MG TABS tablet Take 10 mg by mouth daily.     EQ ASPIRIN ADULT LOW DOSE 81 MG tablet Take 81 mg by mouth daily.     FLUoxetine (PROZAC) 40 MG capsule Take 40 mg by mouth daily.     lithium carbonate (ESKALITH) 450 MG CR tablet Take 450 mg by mouth at bedtime.     losartan (COZAAR) 25 MG tablet Take 0.5 tablets (12.5 mg total) by mouth at bedtime. 45 tablet 3   potassium chloride SA (KLOR-CON M) 20 MEQ tablet Take 1 tablet (20 mEq total) by mouth daily. 10 tablet 0   prazosin (MINIPRESS) 5 MG capsule Take 5 mg by mouth at bedtime as needed (sleep (PTSD)).     pregabalin (LYRICA) 200 MG capsule Take 200 mg by mouth 2 (two) times daily.     senna-docusate (SENOKOT-S) 8.6-50 MG tablet Take 1 tablet by mouth 2 (two) times daily. 30 tablet 0   spironolactone (ALDACTONE) 25 MG tablet Take 0.5 tablets (12.5 mg total) by mouth daily. 45 tablet 3   tiZANidine (ZANAFLEX) 4 MG tablet Take 4 mg by mouth 2 (two) times daily as needed for muscle spasms.     torsemide (DEMADEX) 20 MG tablet Take 2 tablets (40 mg total) by mouth 2 (two) times daily. 180 tablet 3   traZODone (DESYREL) 50 MG tablet Take 50 mg by mouth at bedtime as needed for sleep.     No current facility-administered medications for this encounter.   Allergies  Allergen Reactions   Amoxicillin Diarrhea   Imitrex [Sumatriptan] Other (See Comments)    Makes migraine worse   Social History   Socioeconomic History    Marital status: Married    Spouse name: Not on file   Number of children: Not on file   Years of education: Not on file   Highest education level: Not on file  Occupational History   Not on file  Tobacco Use   Smoking status: Every Day    Types: Cigars   Smokeless tobacco: Never  Vaping Use   Vaping Use: Never used  Substance and Sexual Activity   Alcohol use: Never  Drug use: Never   Sexual activity: Not on file  Other Topics Concern   Not on file  Social History Narrative   Not on file   Social Determinants of Health   Financial Resource Strain: Low Risk  (12/10/2021)   Overall Financial Resource Strain (CARDIA)    Difficulty of Paying Living Expenses: Not very hard  Food Insecurity: No Food Insecurity (12/10/2021)   Hunger Vital Sign    Worried About Running Out of Food in the Last Year: Never true    Ran Out of Food in the Last Year: Never true  Transportation Needs: No Transportation Needs (12/10/2021)   PRAPARE - Administrator, Civil Service (Medical): No    Lack of Transportation (Non-Medical): No  Physical Activity: Not on file  Stress: Not on file  Social Connections: Not on file  Intimate Partner Violence: Not on file   Family History  Problem Relation Age of Onset   Asthma Mother    Heart failure Mother    Fibromyalgia Mother    Diabetes Father    Coronary artery disease Father    Heart disease Paternal Grandfather   Mother has HF + ICD, father has HF. Father and paternal GF + MIs  BP (!) 128/100   Pulse 81   Ht 5\' 7"  (1.702 m)   Wt 134.6 kg (296 lb 12.8 oz)   SpO2 100%   BMI 46.49 kg/m   Wt Readings from Last 3 Encounters:  02/11/22 134.6 kg (296 lb 12.8 oz)  12/23/21 130.2 kg (287 lb)  12/10/21 (!) 138 kg (304 lb 3.2 oz)   PHYSICAL EXAM: General:  NAD. No resp difficulty, walked into clinic. HEENT: Normal Neck: Supple. No JVD, thick neck. Carotids 2+ bilat; no bruits. No lymphadenopathy or thryomegaly appreciated. Cor: PMI  nondisplaced. Regular rate & rhythm. No rubs, gallops or murmurs. Lungs: Clear Abdomen: Obese, nontender, nondistended. No hepatosplenomegaly. No bruits or masses. Good bowel sounds. Extremities: No cyanosis, clubbing, rash, trace pedal edema Neuro: Alert & oriented x 3, cranial nerves grossly intact. Moves all 4 extremities w/o difficulty. Affect pleasant.  ECG: NSR 77 bpm (personally reviewed)  ASSESSMENT & PLAN: Chronic Systolic Heart Failure/NICM - Echo 2018 [facility]): EF 55%, mild LVH, RV normal  - Echo 2022 ([facility]): EF 20%, diffuse HK, RV mildly reduced, RVSP 49 mmgh, mod TR  - Echo (6/23) [facility]): EF 20-25%, RV read as " normal" on echo report, but suspect some underlying RV dysfunction base on recent history of abdominal pain/elevated HLFts and imaging c/w congestive hepatopathy - R/LHC (8/23) mild non obs CAD, severe NICM, elevated filling pressures with low CO. - Etiology ? HTN CM, vs Familial vs ? Viral CM.  - Arrange for cMRI. - NYHA II. Volume looks OK on exam, but weight up some. - Stop losartan. - Start Entresto 24/26 mg bid. - Continue Farxiga 10 mg daily. - Continue torsemide 40 mg bid.  - Continue spironolactone 12.5 mg daily.  - Continue Coreg 3.125 mg bid. - Repeat echo when GDMT optimized. - Labs today. BMET in 10 days. - He has a scale, I asked him to weigh daily. - Will need improved glucose control and med compliance if we pursue advanced therapies, should EF not improve with GDMT optimization.    2. HTN - Elevated today. - GDMT per above   3. CAD - Mild on cath (8/23) - No chest pain - Restart atorva 20 mg daily. - Continue beta blocker + ASA. -  Check lipids/LFTs in 6-8 weeks.   4. Suspected OSA - obesity, + HTN and h/o snoring - He has home sleep study, needs to complete.   5. Type 2DM  - Poorly controlled, Hgb A1c 10.4  - On SGLT2i. - Needs better glucose control.   6. Tobacco Abuse - Discussed cessation.   7. H/o transaminitis - hepatitis  panel negative.  - [place] c/w congestive hepatopathy - LFTs now normalized. - Restart atorvastatin 20 mg daily. - CMET today.  8. Obesity - Body mass index is 46.49 kg/m. - Needs weight loss. - Consider referral to PharmD for GLP1RA  9. SDOH - He has a PCP and psychiatrist. - He has insurance, reliable transportation, a phone and a scale. - No issues affording medications.   Follow up in 3 weeks with PharmD and 2-3 months with Dr. Gala Romney + echo.  [facility], FNP-BC. 02/11/22  Greater than 50% of the (total minutes 40 minutes) visit spent in counseling/coordination of care regarding ( details of discussion systolic heart failure, causes of CM, testing and GDMT)

## 2022-02-11 NOTE — Patient Instructions (Addendum)
EKG done today.  Labs done today. We will contact you only if your labs are abnormal.  RESTART Atorvastatin (Lipitor) 20mg  (1 tablet) by mouth daily.  STOP taking Losartan  START Entresto 24-26mg  (1 tablet) by mouth 2 times daily.   No other medication changes were made. Please continue all current medications as prescribed.  Your physician has requested that you have a cardiac MRI. Cardiac MRI uses a computer to create images of your heart as its beating, producing both still and moving pictures of your heart and major blood vessels. This has to be approved through your insurance company prior to scheduling, once approved we will contact you to schedule an appointment.  Your provider has recommended that you have a home sleep study. Please download the app and follow the instructions. YOUR PIN NUMBER IS: [id number]. Once you have completed the test you just dispose of the equipment, the information is automatically uploaded to via blue-tooth technology. If your test is positive for sleep apnea and you need a home CPAP machine you will be contacted by Dr [place] office [facility]) to set this up.  Your physician recommends that you schedule a follow-up appointment in: 10 days for a lab only appointment, 3 weeks with Pharmacy Clinic and in 2-3 months with Dr. [facility] with an echo prior to your exam.   Your physician has requested that you have an echocardiogram. Echocardiography is a painless test that uses sound waves to create images of your heart. It provides your doctor with information about the size and shape of your heart and how well your heart's chambers and valves are working. This procedure takes approximately one hour. There are no restrictions for this procedure.  If you have any questions or concerns before your next appointment please send Gala Romney a message through [place] or call our office at [phone].    TO LEAVE A MESSAGE FOR THE NURSE SELECT OPTION 2, PLEASE LEAVE A  MESSAGE INCLUDING: YOUR NAME DATE OF BIRTH CALL BACK NUMBER REASON FOR CALL**this is important as we prioritize the call backs  YOU WILL RECEIVE A CALL BACK THE SAME DAY AS LONG AS YOU CALL BEFORE 4:00 PM   Do the following things EVERYDAY: Weigh yourself in the morning before breakfast. Write it down and keep it in a log. Take your medicines as prescribed Eat low salt foods--Limit salt (sodium) to 2000 mg per day.  Stay as active as you can everyday Limit all fluids for the day to less than 2 liters   At the [facility], you and your health needs are our priority. As part of our continuing mission to provide you with exceptional heart care, we have created designated Provider Care Teams. These Care Teams include your primary Cardiologist (physician) and Advanced Practice Providers (APPs- Physician Assistants and Nurse Practitioners) who all work together to provide you with the care you need, when you need it.   You may see any of the following providers on your designated Care Team at your next follow up: Dr [phone] Dr Arvilla Meres, NP Carron Curie, Robbie Lis [place], PharmD   Please be sure to bring in all your medications bottles to every appointment.

## 2022-02-12 ENCOUNTER — Other Ambulatory Visit (HOSPITAL_COMMUNITY): Payer: Self-pay | Admitting: Cardiology

## 2022-02-12 DIAGNOSIS — R69 Illness, unspecified: Secondary | ICD-10-CM | POA: Diagnosis not present

## 2022-02-12 DIAGNOSIS — M545 Low back pain, unspecified: Secondary | ICD-10-CM | POA: Diagnosis not present

## 2022-02-12 DIAGNOSIS — J45909 Unspecified asthma, uncomplicated: Secondary | ICD-10-CM | POA: Diagnosis not present

## 2022-02-12 DIAGNOSIS — I10 Essential (primary) hypertension: Secondary | ICD-10-CM | POA: Diagnosis not present

## 2022-02-12 DIAGNOSIS — E785 Hyperlipidemia, unspecified: Secondary | ICD-10-CM | POA: Diagnosis not present

## 2022-02-12 DIAGNOSIS — E669 Obesity, unspecified: Secondary | ICD-10-CM | POA: Diagnosis not present

## 2022-02-12 DIAGNOSIS — I5022 Chronic systolic (congestive) heart failure: Secondary | ICD-10-CM

## 2022-02-12 DIAGNOSIS — M25551 Pain in right hip: Secondary | ICD-10-CM | POA: Diagnosis not present

## 2022-02-12 DIAGNOSIS — E1169 Type 2 diabetes mellitus with other specified complication: Secondary | ICD-10-CM | POA: Diagnosis not present

## 2022-02-12 DIAGNOSIS — I509 Heart failure, unspecified: Secondary | ICD-10-CM | POA: Diagnosis not present

## 2022-02-12 DIAGNOSIS — K273 Acute peptic ulcer, site unspecified, without hemorrhage or perforation: Secondary | ICD-10-CM | POA: Diagnosis not present

## 2022-02-12 DIAGNOSIS — M549 Dorsalgia, unspecified: Secondary | ICD-10-CM | POA: Diagnosis not present

## 2022-02-12 DIAGNOSIS — R112 Nausea with vomiting, unspecified: Secondary | ICD-10-CM | POA: Diagnosis not present

## 2022-02-12 DIAGNOSIS — K219 Gastro-esophageal reflux disease without esophagitis: Secondary | ICD-10-CM | POA: Diagnosis not present

## 2022-02-12 DIAGNOSIS — K5900 Constipation, unspecified: Secondary | ICD-10-CM | POA: Diagnosis not present

## 2022-02-12 DIAGNOSIS — G8929 Other chronic pain: Secondary | ICD-10-CM | POA: Diagnosis not present

## 2022-02-23 ENCOUNTER — Other Ambulatory Visit (HOSPITAL_COMMUNITY): Payer: 59

## 2022-03-05 ENCOUNTER — Inpatient Hospital Stay (HOSPITAL_COMMUNITY): Admission: RE | Admit: 2022-03-05 | Payer: 59 | Source: Ambulatory Visit

## 2022-04-29 ENCOUNTER — Telehealth (HOSPITAL_COMMUNITY): Payer: Self-pay | Admitting: *Deleted

## 2022-04-29 NOTE — Telephone Encounter (Signed)
Auth request for CMRI faxed to [employer]

## 2022-05-01 ENCOUNTER — Telehealth (HOSPITAL_COMMUNITY): Payer: Self-pay | Admitting: *Deleted

## 2022-05-01 NOTE — Telephone Encounter (Signed)
Attempted to call patient regarding upcoming cardiac MRI appointment. Left message on voicemail with name and callback number  Nevada Mullett RN Navigator Cardiac Imaging [facility] [phone] Office [phone] Cell  

## 2022-05-04 ENCOUNTER — Ambulatory Visit (HOSPITAL_COMMUNITY): Admission: RE | Admit: 2022-05-04 | Payer: 59 | Source: Ambulatory Visit

## 2022-05-26 NOTE — Progress Notes (Signed)
Patient did not show for appointment. Note left for templating purposes only   ADVANCED HF CLINIC NOTE   Primary Care: Simone Curia, MD Psychiatrist: Dr. Jackquline Berlin HF Cardiologist: Dr. Gala Romney  HPI: Gilbert Graham is a 31 y.o. w/ obesity, HTN, poorly controlled DM, chronic systolic heart failure and reported h/o bipolar disorder and PTSD.    Echo in 2018, done at [facility], showed normal LVEF, 55%, mild LVH, normal RV.   Echo 9/22, [facility] in [place], showed severely reduced EF, 20%, w/ diffuse HK, and mildly reduced RV systolic function, moderate TR and moderately elevated RVSP 49 mmhg. Cardiac cath was recommended but pt declined. Also declined LifeVest and was lost to f/u.    Recently moved to the [place]. Had been out of his home meds for a while. W/o local PCP.    Seen in [facility] 5/23 w/ RUQ pain and dyspnea. Found to be in a/c CHF, in the setting of hypertensive urgency. LFTs elevated  AST of 1744 and ALT 1682.  GI work-up suggestive of possible heart failure related congestive hepatopathy, hepatitis panel negative. UDS +  for benzodiazepines, amphetamines, THC and opiates. Diuresed with IV lasix. Echo showed EF 20-25%, RV normal, RVSP moderately elevated at 46 mmHg. GDMT titrated. Atorvastatin discontinued given elevated LFTs. Discharged home, weight 299 lb.    Seen in [facility] 8/23, NYHA II, continued with episodes of left chest and arm pain. Arranged for [facility] to quantify etiology of HF and evaluate for CAD.   R/LHC (8/23) showed minimal CAD, severe NICM with EF 20-25%, and elevated filling pressures with low CO.  Today he returns for HF follow up with his wife, referred from [facility]. Overall feeling fine. He is not SOB with ADLs, has mild dyspnea walking up inclines. Denies palpitations, CP, dizziness, edema, or PND/Orthopnea. Appetite ok. No fever or chills. Weight at home 288 pounds but not weighing regularly. Taking all medications. Smoking 5 cigs/day, no ETOH/drugs. Psych meds  managed by Dr. Jackquline Berlin.  Cardiac Testing    - R/LHC (8/23):    Prox RCA lesion is 20% stenosed.   The left ventricular ejection fraction is less than 25% by visual estimate.   Ao = 112/83 (98) LV = 120/38 RA = 14 RV = 60/15 PA = 60/32 (43) PCW = 30 Fick cardiac output/index = 4.8/2.1 PVR = 2.7 wu Ao sat = 96% PA sat = 58%, 60% Assessment 1. Minimal CAD 2. Severe NICM EF 20-25% 3. Elevated filling pressures with low cardiac output   - Echo (6/23 at [facility]): EF 20-25%, severe LV dysfunction with global HK, mild LVH, RV ok  - Echo 2022 ([facility]): EF 20%, diffuse HK, RV mildly reduced, RVSP 49 mmgh, mod TR   - Echo 2018 [facility]): EF 55%, mild LVH, RV normal   Past Medical History:  Diagnosis Date   CHF (congestive heart failure) (HCC)    Diabetes mellitus without complication (HCC)    Scoliosis    Current Outpatient Medications  Medication Sig Dispense Refill   albuterol (VENTOLIN HFA) 108 (90 Base) MCG/ACT inhaler Inhale 2 puffs into the lungs every 6 (six) hours as needed for wheezing or shortness of breath. 1 each 1   alum & mag hydroxide-simeth (MAALOX/MYLANTA) 200-200-20 MG/5ML suspension Take 30 mLs by mouth every 6 (six) hours as needed for indigestion or heartburn.     atorvastatin (LIPITOR) 20 MG tablet Take 1 tablet (20 mg total) by mouth daily. 90 tablet 3   carvedilol (COREG) 3.125 MG  tablet Take 3.125 mg by mouth 2 (two) times daily.     clonazePAM (KLONOPIN) 0.5 MG tablet Take 0.5 mg by mouth daily as needed for anxiety.     dapagliflozin propanediol (FARXIGA) 10 MG TABS tablet Take 10 mg by mouth daily.     EQ ASPIRIN ADULT LOW DOSE 81 MG tablet Take 81 mg by mouth daily.     FLUoxetine (PROZAC) 40 MG capsule Take 40 mg by mouth daily.     lithium carbonate (ESKALITH) 450 MG CR tablet Take 450 mg by mouth at bedtime.     potassium chloride SA (KLOR-CON M) 20 MEQ tablet Take 1 tablet (20 mEq total) by mouth daily. 10 tablet 0   prazosin (MINIPRESS) 5 MG capsule  Take 5 mg by mouth at bedtime as needed (sleep (PTSD)).     pregabalin (LYRICA) 200 MG capsule Take 200 mg by mouth 2 (two) times daily.     sacubitril-valsartan (ENTRESTO) 24-26 MG Take 1 tablet by mouth 2 (two) times daily. 60 tablet 11   senna-docusate (SENOKOT-S) 8.6-50 MG tablet Take 1 tablet by mouth 2 (two) times daily. 30 tablet 0   spironolactone (ALDACTONE) 25 MG tablet Take 0.5 tablets (12.5 mg total) by mouth daily. 45 tablet 3   tiZANidine (ZANAFLEX) 4 MG tablet Take 4 mg by mouth 2 (two) times daily as needed for muscle spasms.     torsemide (DEMADEX) 20 MG tablet Take 2 tablets (40 mg total) by mouth 2 (two) times daily. 180 tablet 3   traZODone (DESYREL) 50 MG tablet Take 50 mg by mouth at bedtime as needed for sleep.     No current facility-administered medications for this encounter.   Allergies  Allergen Reactions   Amoxicillin Diarrhea   Imitrex [Sumatriptan] Other (See Comments)    Makes migraine worse   Social History   Socioeconomic History   Marital status: Married    Spouse name: Not on file   Number of children: Not on file   Years of education: Not on file   Highest education level: Not on file  Occupational History   Not on file  Tobacco Use   Smoking status: Every Day    Types: Cigars   Smokeless tobacco: Never  Vaping Use   Vaping Use: Never used  Substance and Sexual Activity   Alcohol use: Never   Drug use: Never   Sexual activity: Not on file  Other Topics Concern   Not on file  Social History Narrative   Not on file   Social Determinants of Health   Financial Resource Strain: Low Risk  (12/10/2021)   Overall Financial Resource Strain (CARDIA)    Difficulty of Paying Living Expenses: Not very hard  Food Insecurity: No Food Insecurity (12/10/2021)   Hunger Vital Sign    Worried About Running Out of Food in the Last Year: Never true    Ran Out of Food in the Last Year: Never true  Transportation Needs: No Transportation Needs (12/10/2021)    PRAPARE - Administrator, Civil Service (Medical): No    Lack of Transportation (Non-Medical): No  Physical Activity: Not on file  Stress: Not on file  Social Connections: Not on file  Intimate Partner Violence: Not on file   Family History  Problem Relation Age of Onset   Asthma Mother    Heart failure Mother    Fibromyalgia Mother    Diabetes Father    Coronary artery disease Father  Heart disease Paternal Grandfather   Mother has HF + ICD, father has HF. Father and paternal GF + MIs  There were no vitals taken for this visit.  Wt Readings from Last 3 Encounters:  02/11/22 134.6 kg (296 lb 12.8 oz)  12/23/21 130.2 kg (287 lb)  12/10/21 (!) 138 kg (304 lb 3.2 oz)   PHYSICAL EXAM: General:  NAD. No resp difficulty, walked into clinic. HEENT: Normal Neck: Supple. No JVD, thick neck. Carotids 2+ bilat; no bruits. No lymphadenopathy or thryomegaly appreciated. Cor: PMI nondisplaced. Regular rate & rhythm. No rubs, gallops or murmurs. Lungs: Clear Abdomen: Obese, nontender, nondistended. No hepatosplenomegaly. No bruits or masses. Good bowel sounds. Extremities: No cyanosis, clubbing, rash, trace pedal edema Neuro: Alert & oriented x 3, cranial nerves grossly intact. Moves all 4 extremities w/o difficulty. Affect pleasant.  ECG: NSR 77 bpm (personally reviewed)  ASSESSMENT & PLAN: Chronic Systolic Heart Failure/NICM - Echo 2018 [facility]): EF 55%, mild LVH, RV normal  - Echo 2022 ([facility]): EF 20%, diffuse HK, RV mildly reduced, RVSP 49 mmgh, mod TR  - Echo (6/23) [facility]): EF 20-25%, RV read as " normal" on echo report, but suspect some underlying RV dysfunction base on recent history of abdominal pain/elevated HLFts and imaging c/w congestive hepatopathy - R/LHC (8/23) mild non obs CAD, severe NICM, elevated filling pressures with low CO. - Etiology ? HTN CM, vs Familial vs ? Viral CM.  - Arrange for cMRI. - NYHA II. Volume looks OK on exam, but weight up  some. - Stop losartan. - Start Entresto 24/26 mg bid. - Continue Farxiga 10 mg daily. - Continue torsemide 40 mg bid.  - Continue spironolactone 12.5 mg daily.  - Continue Coreg 3.125 mg bid. - Repeat echo when GDMT optimized. - Labs today. BMET in 10 days. - He has a scale, I asked him to weigh daily. - Will need improved glucose control and med compliance if we pursue advanced therapies, should EF not improve with GDMT optimization.    2. HTN - Elevated today. - GDMT per above   3. CAD - Mild on cath (8/23) - No chest pain - Restart atorva 20 mg daily. - Continue beta blocker + ASA. - Check lipids/LFTs in 6-8 weeks.   4. Suspected OSA - obesity, + HTN and h/o snoring - He has home sleep study, needs to complete.   5. Type 2DM  - Poorly controlled, Hgb A1c 10.4  - On SGLT2i. - Needs better glucose control.   6. Tobacco Abuse - Discussed cessation.   7. H/o transaminitis - hepatitis panel negative.  - [place] c/w congestive hepatopathy - LFTs now normalized. - Restart atorvastatin 20 mg daily. - CMET today.  8. Obesity - There is no height or weight on file to calculate BMI. - Needs weight loss. - Consider referral to PharmD for GLP1RA  9. SDOH - He has a PCP and psychiatrist. - He has insurance, reliable transportation, a phone and a scale. - No issues affording medications.   Arvilla Meres, MD  5:21 PM

## 2022-05-27 ENCOUNTER — Inpatient Hospital Stay (HOSPITAL_COMMUNITY)
Admission: RE | Admit: 2022-05-27 | Discharge: 2022-05-27 | Disposition: A | Discharge status: Home / Self Care | Payer: 59 | Source: Ambulatory Visit | Attending: Internal Medicine | Admitting: Internal Medicine

## 2022-05-27 ENCOUNTER — Ambulatory Visit (HOSPITAL_COMMUNITY): Admission: RE | Admit: 2022-05-27 | Payer: 59 | Source: Ambulatory Visit

## 2022-07-15 IMAGING — CT CT ABD-PELV W/ CM
2 of 4 series · 16 of 46 positions shown, 18 images · IV contrast (Omnipaque)
Comparison: None Available.

CLINICAL DATA: Nausea/vomiting RUQ pain. 31 y/o male. Pt reports
right upper abdominal pain radiating to right back since [REDACTED].
Pain increasing and has became constant. Reports projectile vomiting
today x2 episode.

EXAM:
CT ABDOMEN AND PELVIS WITH CONTRAST
TECHNIQUE: Multidetector CT imaging of the abdomen and pelvis was performed
using the standard protocol following bolus administration of
intravenous contrast.

[Series 2: axial st · axial · 0.92mm/px · z∈[-682,-252]mm · 13 of 94 slices shown, 15 images]
[im 4/94  soft-tissue]
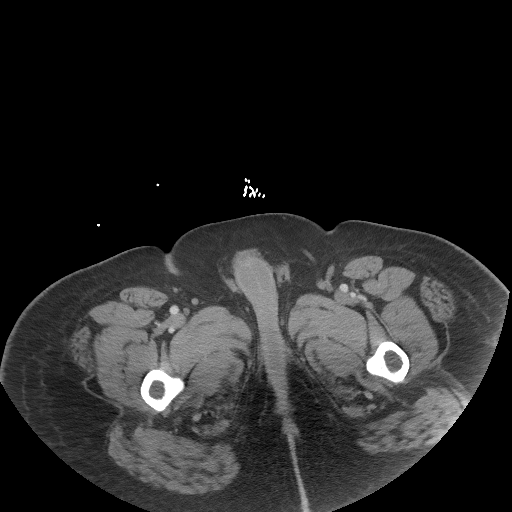
[im 4/94  bone]
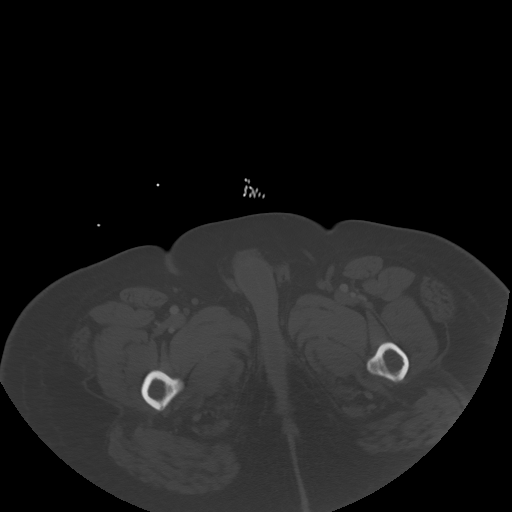
[im 12/94  soft-tissue]
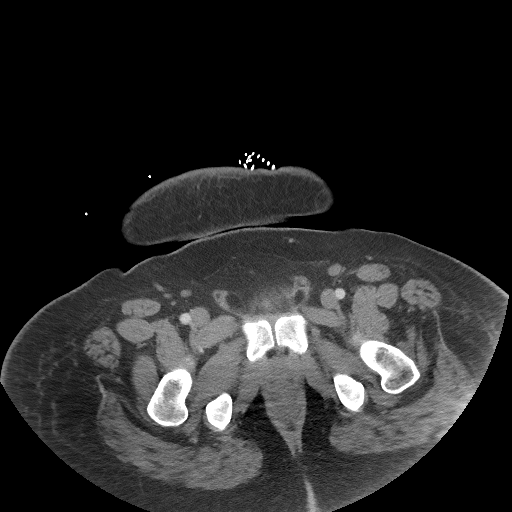
[im 20/94  soft-tissue]
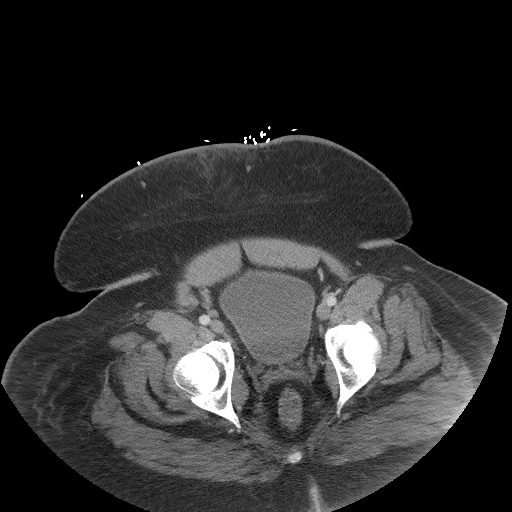
[im 28/94  soft-tissue]
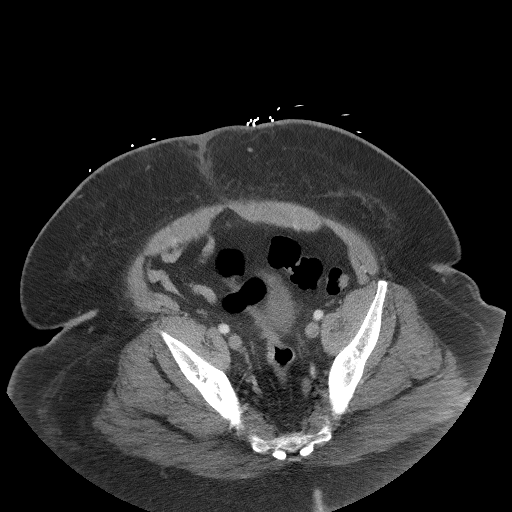
[im 32/94  soft-tissue]
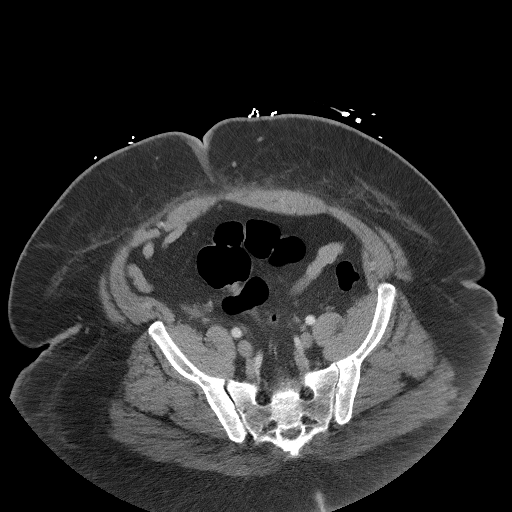
[im 39/94  soft-tissue]
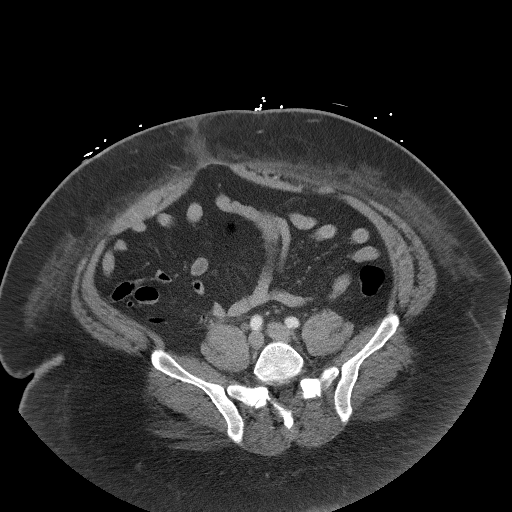
[im 47/94  soft-tissue]
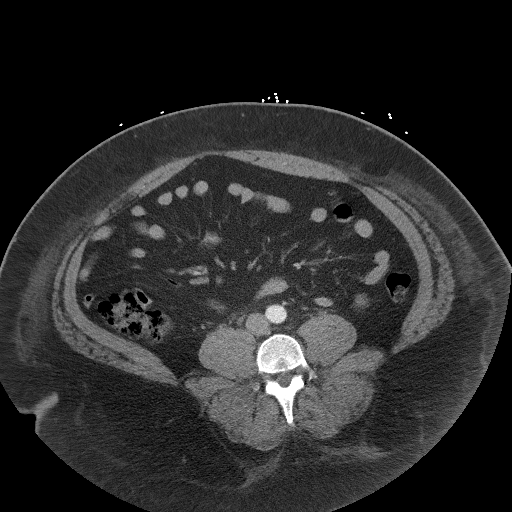
[im 55/94  soft-tissue]
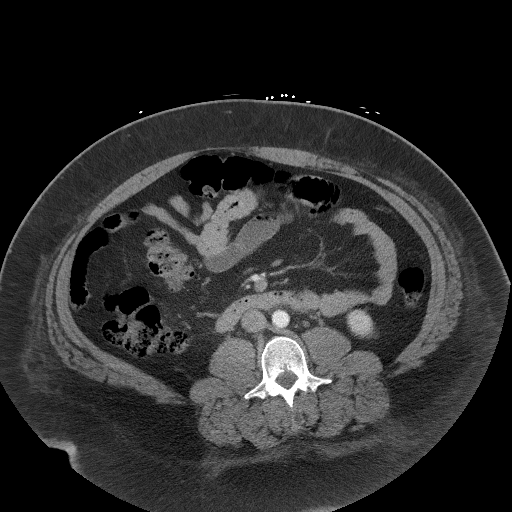
[im 63/94  soft-tissue]
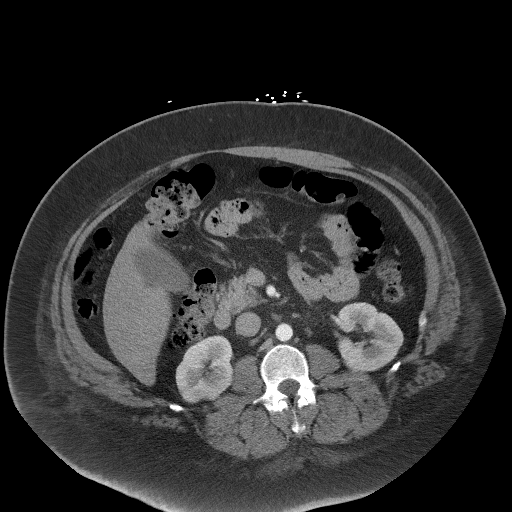
[im 63/94  bone]
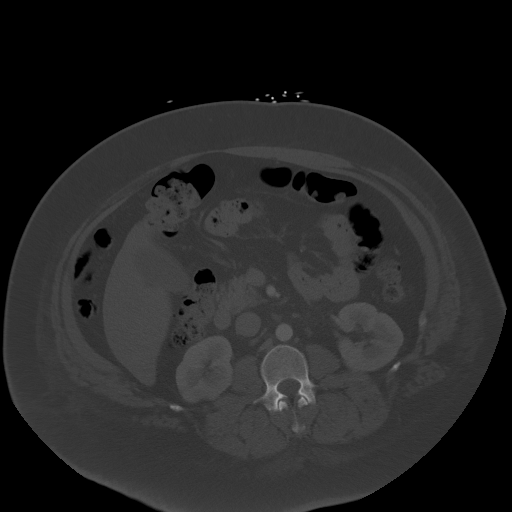
[im 66/94  soft-tissue]
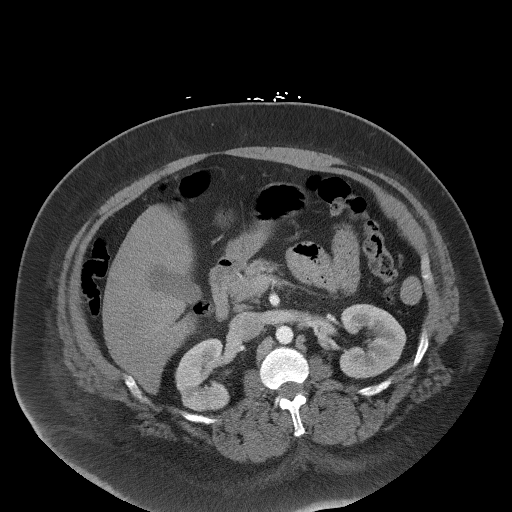
[im 74/94  soft-tissue]
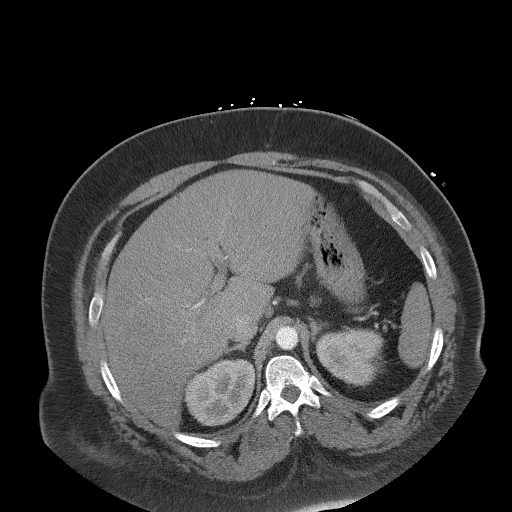
[im 82/94  soft-tissue]
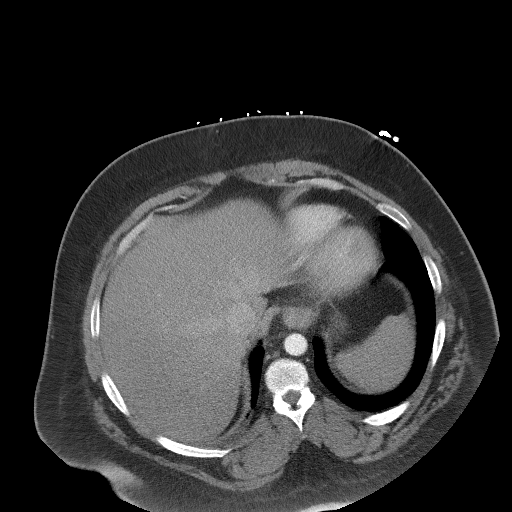
[im 90/94  soft-tissue]
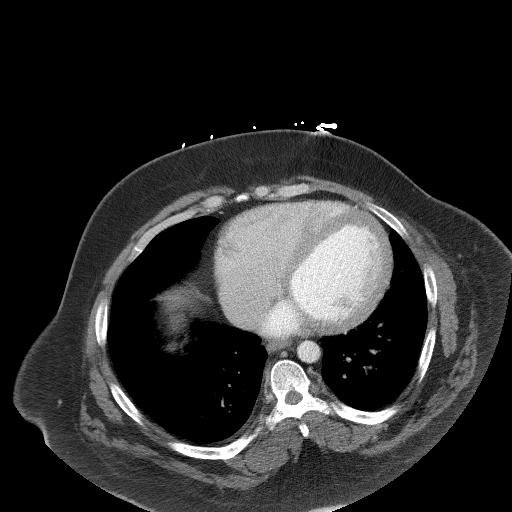

[Series 5: coronal st · coronal · 0.89mm/px · 3 of 125 slices shown]
[im 42/125  soft-tissue]
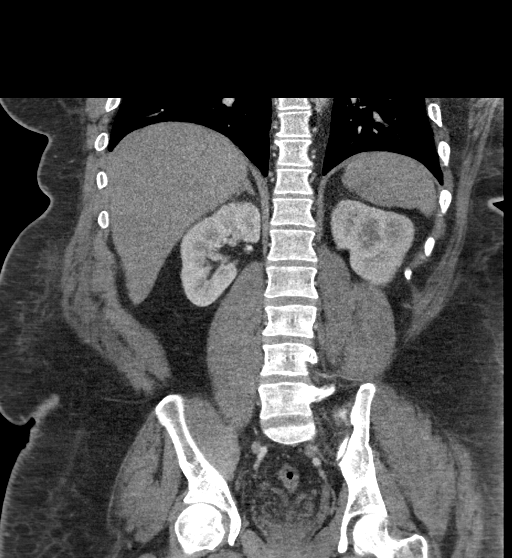
[im 56/125  soft-tissue]
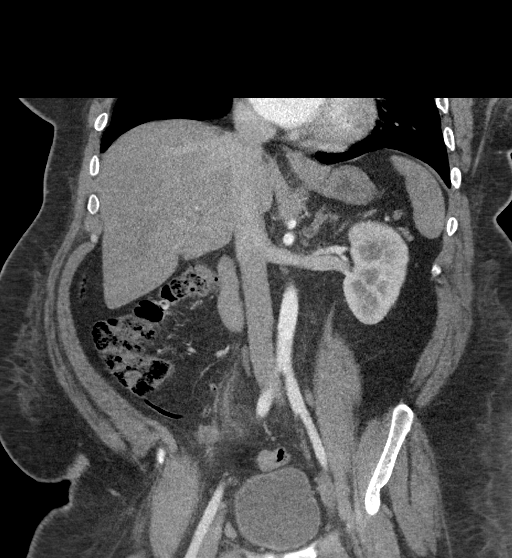
[im 69/125  soft-tissue]
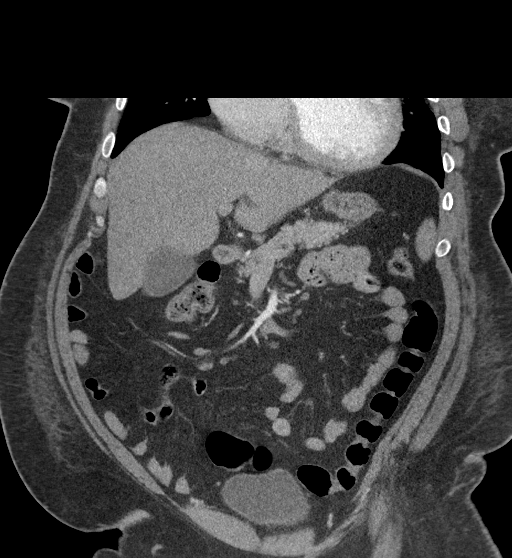

[16 of 46 positions shown; findings below may reference images not displayed]

RADIATION DOSE REDUCTION: This exam was performed according to the
departmental dose-optimization program which includes automated
exposure control, adjustment of the mA and/or kV according to
patient size and/or use of iterative reconstruction technique.

CONTRAST:  125mL OMNIPAQUE IOHEXOL 300 MG/ML  SOLN
FINDINGS: Lower chest: Prominent cardiac silhouette. Mild peribronchovascular
ground-glass airspace opacity. No acute abnormality.

Hepatobiliary: No focal liver abnormality. No gallstones,
gallbladder wall thickening, or pericholecystic fluid. No biliary
dilatation.

Pancreas: No focal lesion. Normal pancreatic contour. No surrounding
inflammatory changes. No main pancreatic ductal dilatation.

Spleen: Normal in size without focal abnormality.

Adrenals/Urinary Tract:

No adrenal nodule bilaterally.

Bilateral kidneys enhance symmetrically.

No hydronephrosis. No hydroureter.

The urinary bladder is unremarkable.

Stomach/Bowel: Stomach is within normal limits. No evidence of bowel
wall thickening or dilatation. Appendix appears normal.

Vascular/Lymphatic: No abdominal aorta or iliac aneurysm. No
abdominal, pelvic, or inguinal lymphadenopathy.

Reproductive: Prostate is unremarkable.

Other: No intraperitoneal free fluid. No intraperitoneal free gas.
No organized fluid collection.

Musculoskeletal:

Subcutaneus soft tissue edema.  No hernia.

No suspicious lytic or blastic osseous lesions. No acute displaced
fracture. Multilevel degenerative changes of the spine.
IMPRESSION: 1. No acute intra-abdominal or intrapelvic abnormality.
2. Mild pulmonary edema in the setting of cardiomegaly.

## 2022-07-16 IMAGING — DX DG ABDOMEN 1V
1 series · 1 of 1 positions shown · non-contrast
Comparison: CT October 29, 2021

CLINICAL DATA: Abdominal pain elevated LFTs.

EXAM:
ABDOMEN - 1 VIEW

[abdomen kub]
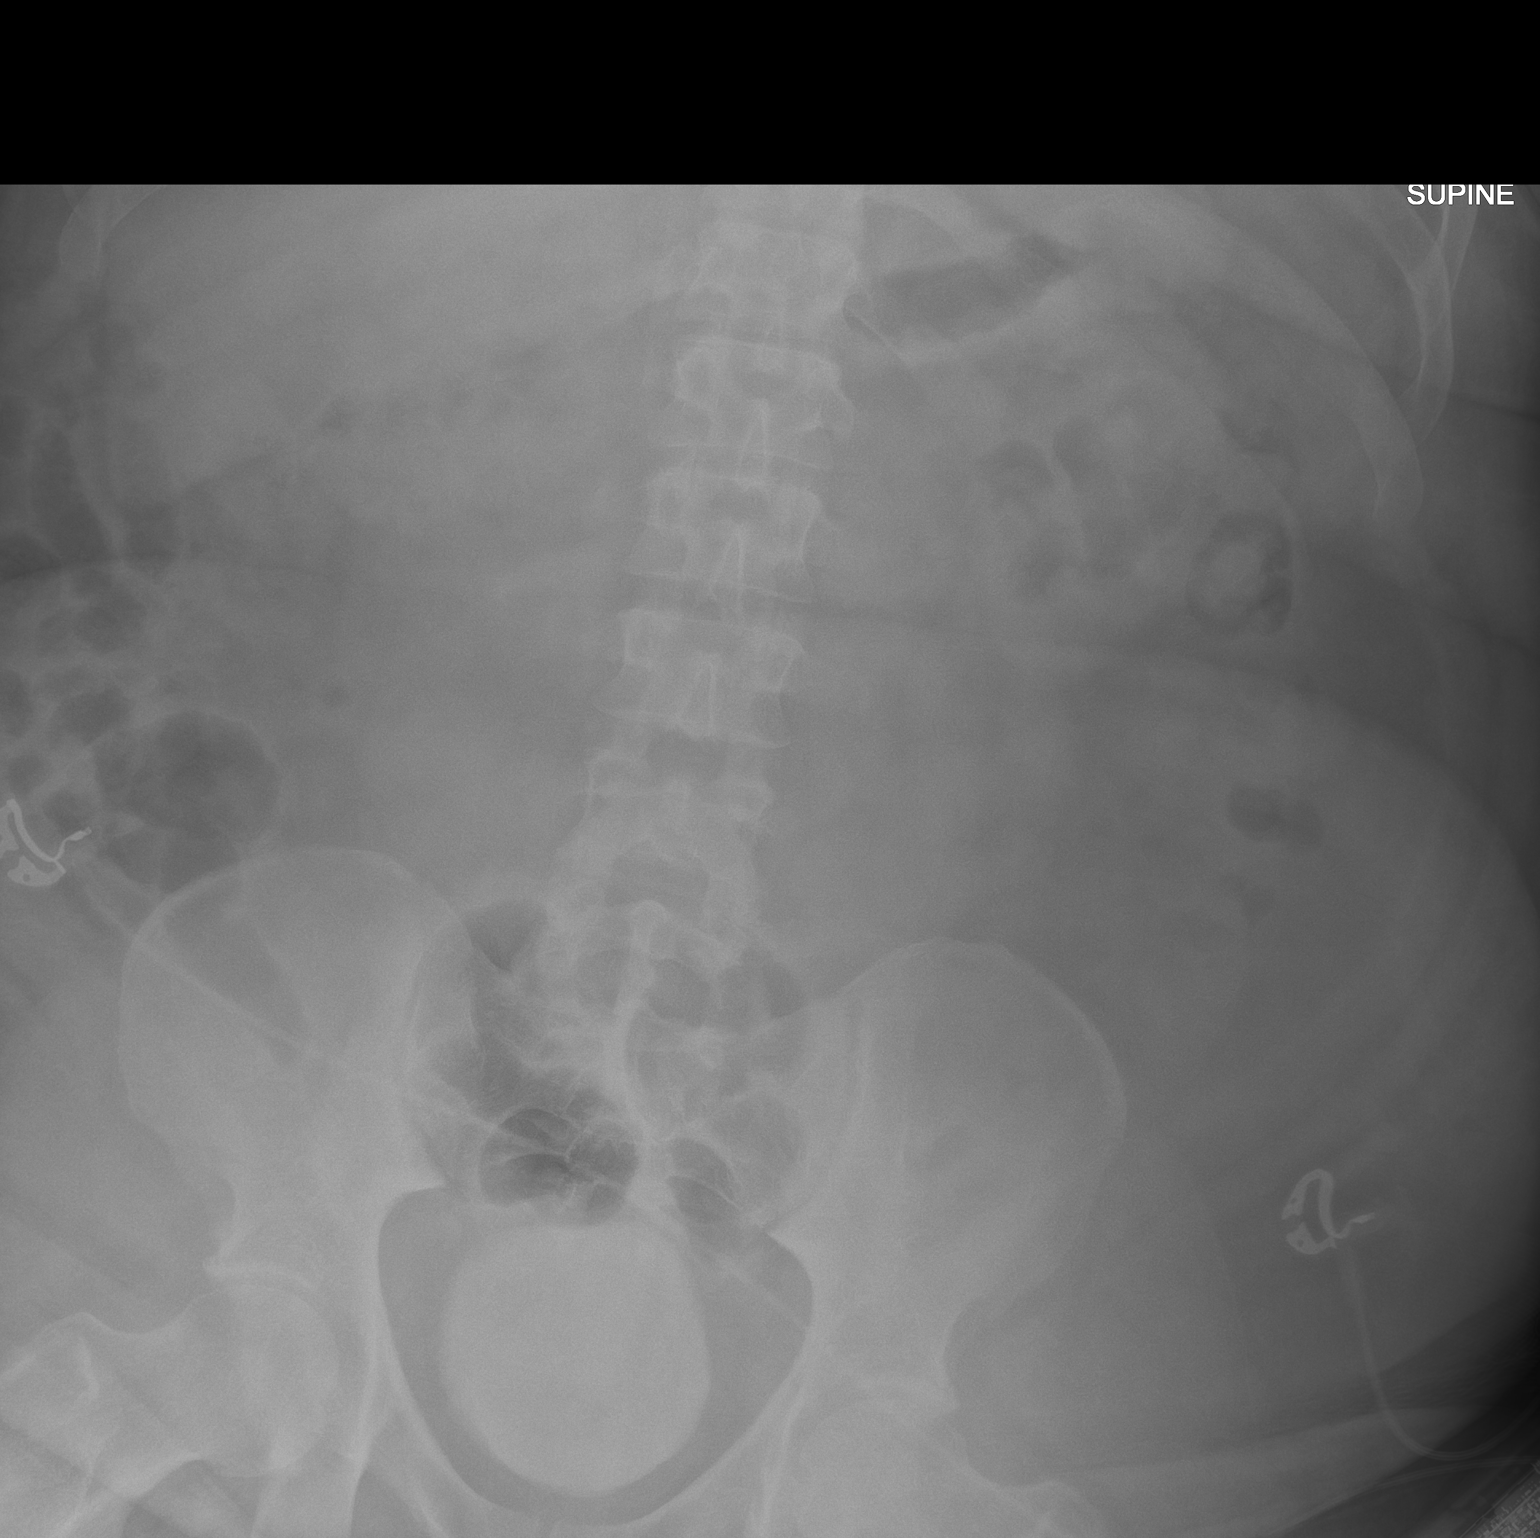

[1 of 1 positions shown; findings below may reference images not displayed]

FINDINGS: The bowel gas pattern is normal. Radiopaque excreted contrast
material in the bladder.
IMPRESSION: Normal bowel gas pattern.

## 2022-10-19 ENCOUNTER — Telehealth: Payer: Self-pay

## 2022-10-19 NOTE — Telephone Encounter (Signed)
**Note De-Identified Margaret Cockerill Obfuscation** Per Jenna Luo at [place], no PA is required for [employer].

## 2023-02-15 ENCOUNTER — Telehealth: Payer: Self-pay | Admitting: Surgery

## 2023-02-15 NOTE — Telephone Encounter (Signed)
I attempted to reach Gilbert Graham to let him know that he can proceed with his ordered home sleep study.  I was unable to leave a message at the number indicated in the chart.
